# Patient Record
Sex: Male | Born: 1935 | Race: White | Hispanic: No | Smoking: Former smoker
Health system: Southern US, Community
[De-identification: ages and names within clinical notes are randomized; demographics above are authoritative.]

## PROBLEM LIST (undated history)

## (undated) DIAGNOSIS — G459 Transient cerebral ischemic attack, unspecified: Secondary | ICD-10-CM

## (undated) DIAGNOSIS — E119 Type 2 diabetes mellitus without complications: Secondary | ICD-10-CM

---

## 2019-08-01 ENCOUNTER — Other Ambulatory Visit: Payer: Self-pay

## 2019-08-01 ENCOUNTER — Inpatient Hospital Stay
Admission: EM | Admit: 2019-08-01 | Discharge: 2019-08-07 | DRG: 065 | Disposition: A | Discharge status: Skilled Nursing Facility | Payer: Medicare Other | Source: Skilled Nursing Facility | Attending: Family Medicine | Admitting: Family Medicine

## 2019-08-01 ENCOUNTER — Emergency Department: Payer: Medicare Other

## 2019-08-01 DIAGNOSIS — W19XXXA Unspecified fall, initial encounter: Secondary | ICD-10-CM | POA: Diagnosis present

## 2019-08-01 DIAGNOSIS — R29704 NIHSS score 4: Secondary | ICD-10-CM | POA: Diagnosis not present

## 2019-08-01 DIAGNOSIS — I69398 Other sequelae of cerebral infarction: Secondary | ICD-10-CM

## 2019-08-01 DIAGNOSIS — Z9181 History of falling: Secondary | ICD-10-CM

## 2019-08-01 DIAGNOSIS — G20 Parkinson's disease: Secondary | ICD-10-CM | POA: Diagnosis present

## 2019-08-01 DIAGNOSIS — R29701 NIHSS score 1: Secondary | ICD-10-CM | POA: Diagnosis present

## 2019-08-01 DIAGNOSIS — Z87891 Personal history of nicotine dependence: Secondary | ICD-10-CM

## 2019-08-01 DIAGNOSIS — E119 Type 2 diabetes mellitus without complications: Secondary | ICD-10-CM

## 2019-08-01 DIAGNOSIS — Z8673 Personal history of transient ischemic attack (TIA), and cerebral infarction without residual deficits: Secondary | ICD-10-CM

## 2019-08-01 DIAGNOSIS — Z7984 Long term (current) use of oral hypoglycemic drugs: Secondary | ICD-10-CM

## 2019-08-01 DIAGNOSIS — R2689 Other abnormalities of gait and mobility: Secondary | ICD-10-CM | POA: Diagnosis present

## 2019-08-01 DIAGNOSIS — I6340 Cerebral infarction due to embolism of unspecified cerebral artery: Principal | ICD-10-CM | POA: Diagnosis present

## 2019-08-01 DIAGNOSIS — R55 Syncope and collapse: Secondary | ICD-10-CM

## 2019-08-01 DIAGNOSIS — I10 Essential (primary) hypertension: Secondary | ICD-10-CM | POA: Diagnosis present

## 2019-08-01 DIAGNOSIS — E11649 Type 2 diabetes mellitus with hypoglycemia without coma: Secondary | ICD-10-CM | POA: Diagnosis present

## 2019-08-01 DIAGNOSIS — I2510 Atherosclerotic heart disease of native coronary artery without angina pectoris: Secondary | ICD-10-CM

## 2019-08-01 DIAGNOSIS — I639 Cerebral infarction, unspecified: Secondary | ICD-10-CM | POA: Diagnosis present

## 2019-08-01 DIAGNOSIS — R413 Other amnesia: Secondary | ICD-10-CM | POA: Diagnosis present

## 2019-08-01 DIAGNOSIS — I69319 Unspecified symptoms and signs involving cognitive functions following cerebral infarction: Secondary | ICD-10-CM

## 2019-08-01 DIAGNOSIS — I082 Rheumatic disorders of both aortic and tricuspid valves: Secondary | ICD-10-CM | POA: Diagnosis present

## 2019-08-01 DIAGNOSIS — Z20822 Contact with and (suspected) exposure to covid-19: Secondary | ICD-10-CM | POA: Diagnosis present

## 2019-08-01 DIAGNOSIS — N179 Acute kidney failure, unspecified: Secondary | ICD-10-CM | POA: Diagnosis present

## 2019-08-01 DIAGNOSIS — E785 Hyperlipidemia, unspecified: Secondary | ICD-10-CM | POA: Diagnosis present

## 2019-08-01 HISTORY — DX: Type 2 diabetes mellitus without complications: E11.9

## 2019-08-01 HISTORY — DX: Transient cerebral ischemic attack, unspecified: G45.9

## 2019-08-01 LAB — GLUCOSE, CAPILLARY: Glucose-Capillary: 90 mg/dL (ref 70–99)

## 2019-08-01 LAB — CBC WITH DIFFERENTIAL/PLATELET
Abs Immature Granulocytes: 0.05 10*3/uL (ref 0.00–0.07)
Basophils Absolute: 0 10*3/uL (ref 0.0–0.1)
Basophils Relative: 0 %
Eosinophils Absolute: 0 10*3/uL (ref 0.0–0.5)
Eosinophils Relative: 0 %
HCT: 51.7 % (ref 39.0–52.0)
Hemoglobin: 16.4 g/dL (ref 13.0–17.0)
Immature Granulocytes: 1 %
Lymphocytes Relative: 8 %
Lymphs Abs: 0.7 10*3/uL (ref 0.7–4.0)
MCH: 29.9 pg (ref 26.0–34.0)
MCHC: 31.7 g/dL (ref 30.0–36.0)
MCV: 94.3 fL (ref 80.0–100.0)
Monocytes Absolute: 0.2 10*3/uL (ref 0.1–1.0)
Monocytes Relative: 3 %
Neutro Abs: 7.4 10*3/uL (ref 1.7–7.7)
Neutrophils Relative %: 88 %
Platelets: 76 10*3/uL — ABNORMAL LOW (ref 150–400)
RBC: 5.48 MIL/uL (ref 4.22–5.81)
RDW: 13.7 % (ref 11.5–15.5)
WBC: 8.4 10*3/uL (ref 4.0–10.5)
nRBC: 0 % (ref 0.0–0.2)

## 2019-08-01 LAB — TROPONIN I (HIGH SENSITIVITY): Troponin I (High Sensitivity): 19 ng/L — ABNORMAL HIGH (ref <18)

## 2019-08-01 LAB — COMPREHENSIVE METABOLIC PANEL
ALT: 17 U/L (ref 0–44)
AST: 35 U/L (ref 15–41)
Albumin: 4.5 g/dL (ref 3.5–5.0)
Alkaline Phosphatase: 57 U/L (ref 38–126)
Anion gap: 13 (ref 5–15)
BUN: 30 mg/dL — ABNORMAL HIGH (ref 8–23)
CO2: 25 mmol/L (ref 22–32)
Calcium: 10.1 mg/dL (ref 8.9–10.3)
Chloride: 99 mmol/L (ref 98–111)
Creatinine, Ser: 1.82 mg/dL — ABNORMAL HIGH (ref 0.61–1.24)
GFR calc Af Amer: 38 mL/min — ABNORMAL LOW (ref >60)
GFR calc non Af Amer: 33 mL/min — ABNORMAL LOW (ref >60)
Glucose, Bld: 140 mg/dL — ABNORMAL HIGH (ref 70–99)
Potassium: 4.9 mmol/L (ref 3.5–5.1)
Sodium: 137 mmol/L (ref 135–145)
Total Bilirubin: 1.4 mg/dL — ABNORMAL HIGH (ref 0.3–1.2)
Total Protein: 8.3 g/dL — ABNORMAL HIGH (ref 6.5–8.1)

## 2019-08-01 MED ORDER — SODIUM CHLORIDE 0.9% FLUSH
3.0000 mL | Freq: Two times a day (BID) | INTRAVENOUS | Status: DC
  Administered 2019-08-02 – 2019-08-07 (×10): 3 mL via INTRAVENOUS

## 2019-08-01 MED ORDER — ENOXAPARIN SODIUM 30 MG/0.3ML ~~LOC~~ SOLN
30.0000 mg | SUBCUTANEOUS | Status: DC
  Administered 2019-08-02: 01:00:00 30 mg via SUBCUTANEOUS
  Filled 2019-08-01: qty 0.3

## 2019-08-01 NOTE — ED Notes (Signed)
Pt given phone to speak with daughter °

## 2019-08-01 NOTE — ED Notes (Signed)
Attempted for 20g at R medial ac. Was able to draw blood but couldn't thread catheter. Vein blew. Blood sent. Will attempt again once pt back from CT.

## 2019-08-01 NOTE — ED Triage Notes (Signed)
Pt arrives via EMS from [facility] due to a reported syncopal episode and fall. Pt was last seen at 1PM today and was found prior to arrival to ED. PT does not remember events of what happened, was found on balcony of room. Reported that pt did not report to dinner at 5PM this afternoon.   Pt arrives to ED with no complaints, bruise noted on back of head, and pt is alert and oriented to person and place. EMS states pt has been shuffling when he ambulated with them which is not normal.

## 2019-08-01 NOTE — H&P (Addendum)
History and Physical    Aaron Robles ALP:[id number] DOB: 1931/11/21 DOA: 08/01/2019  PCP: System, Pcp Not In   Patient coming from: ALF I have personally briefly reviewed patient's old medical records in [place]  Chief Complaint altered mental status HPI: Aaron Robles is a 86 y.o. 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  Last known normal was at 1 PM.  Around 5 PM which was dinnertime patient did not come to the dining hall and he was found lying on his balcony.  He was awake when he was found but was somewhat confused and has no recollection of the events.  Was noted to be shuffling when he walked which they said was not his normal.  Patient denies pain.  Denies lightheadedness, chest pain, shortness of breath.  Was previously in his usual state of health  ED Course: Vitals in the emergency room were within normal limits.  Blood work significant for creatinine of 1.82.  Troponin was normal at 19.  Showed no acute ST-T wave changes.  Chest x-ray no acute disease.  Head and C-spine CT unremarkable.  Hospitalist consulted for admission for syncopal work-up.  Review of Systems: As per HPI otherwise 10 point review of systems negative.    Past Medical History:  Diagnosis Date   Diabetes mellitus without complication ([place])    TIA (transient ischemic attack)     History reviewed. No pertinent surgical history.   reports that he quit smoking about 10 years ago. He has never used smokeless tobacco. He reports that he does not drink alcohol or use drugs.  No Known Allergies  History reviewed. No pertinent family history.   Prior to Admission medications   Not on File    Physical Exam: Vitals:   08/01/19 2200 08/01/19 2215 08/01/19 2245 08/01/19 2330  BP: 135/83   133/89  Pulse: 74 76 82 74  Resp: (!) 24 19 14 20   Temp:      TempSrc:      SpO2: 100% 99% 98% 97%  Weight:      Height:          Vitals:   08/01/19 2200 08/01/19 2215 08/01/19 2245 08/01/19 2330  BP: 135/83   133/89  Pulse: 74 76 82 74  Resp: (!) 24 19 14 20   Temp:      TempSrc:      SpO2: 100% 99% 98% 97%  Weight:      Height:        Constitutional: Alert and awake, oriented x2, not in any acute distress. Eyes: PERLA, EOMI, irises appear normal, anicteric sclera,  ENMT: external ears and nose appear normal, normal hearing             Lips appears normal, oropharynx mucosa, tongue, posterior pharynx appear normal  Neck: neck appears normal, no masses, normal ROM, no thyromegaly, no JVD  CVS: S1-S2 clear, no murmur rubs or gallops,  , no carotid bruits, pedal pulses palpable, No LE edema Respiratory:  clear to auscultation bilaterally, no wheezing, rales or rhonchi. Respiratory effort normal. No accessory muscle use.  Abdomen: soft nontender, nondistended, normal bowel sounds, no hepatosplenomegaly, no hernias Musculoskeletal: : no cyanosis, clubbing , no contractures or atrophy Neuro: Cranial nerves II-XII intact, sensation, reflexes normal, strength Psych: judgement and insight appear normal, stable mood and affect,  Skin: no rashes or lesions or ulcers, no induration or nodules   Labs on Admission:  I have personally reviewed following labs and imaging studies  CBC: Recent Labs  Lab 08/01/19 2134  WBC 8.4  NEUTROABS 7.4  HGB 16.4  HCT 51.7  MCV 94.3  PLT 76*   Basic Metabolic Panel: Recent Labs  Lab 08/01/19 2134  NA 137  K 4.9  CL 99  CO2 25  GLUCOSE 140*  BUN 30*  CREATININE 1.82*  CALCIUM 10.1   GFR: Estimated Creatinine Clearance: 30.7 mL/min (A) (by C-G formula based on SCr of 1.82 mg/dL (H)). Liver Function Tests: Recent Labs  Lab 08/01/19 2134  AST 35  ALT 17  ALKPHOS 57  BILITOT 1.4*  PROT 8.3*  ALBUMIN 4.5   No results for input(s): LIPASE, AMYLASE in the last 168 hours. No results for input(s): AMMONIA in the last 168 hours. Coagulation Profile: No results  for input(s): INR, PROTIME in the last 168 hours. Cardiac Enzymes: No results for input(s): CKTOTAL, CKMB, CKMBINDEX, TROPONINI in the last 168 hours. BNP (last 3 results) No results for input(s): PROBNP in the last 8760 hours. HbA1C: No results for input(s): HGBA1C in the last 72 hours. CBG: Recent Labs  Lab 08/01/19 2224  GLUCAP 90   Lipid Profile: No results for input(s): CHOL, HDL, LDLCALC, TRIG, CHOLHDL, LDLDIRECT in the last 72 hours. Thyroid Function Tests: No results for input(s): TSH, T4TOTAL, FREET4, T3FREE, THYROIDAB in the last 72 hours. Anemia Panel: No results for input(s): VITAMINB12, FOLATE, FERRITIN, TIBC, IRON, RETICCTPCT in the last 72 hours. Urine analysis: No results found for: COLORURINE, APPEARANCEUR, LABSPEC, PHURINE, GLUCOSEU, HGBUR, BILIRUBINUR, KETONESUR, PROTEINUR, UROBILINOGEN, NITRITE, LEUKOCYTESUR  Radiological Exams on Admission: CT HEAD WO CONTRAST  Result Date: 08/01/2019 CLINICAL DATA:  86 year old male status post syncope and fall. EXAM: CT HEAD WITHOUT CONTRAST TECHNIQUE: Contiguous axial images were obtained from the base of the skull through the vertex without intravenous contrast. COMPARISON:  None. FINDINGS: Brain: Ex vacuo appearing ventricular enlargement. No midline shift, ventriculomegaly, mass effect, evidence of mass lesion, intracranial hemorrhage or evidence of cortically based acute infarction. Patchy and confluent bilateral cerebral white matter hypodensity. Deep gray nuclei appear spared. No cortical encephalomalacia identified. Occasional mild dural calcifications. Vascular: Extensive Calcified atherosclerosis at the skull base. No suspicious intracranial vascular hyperdensity. Skull: No fracture identified. Sinuses/Orbits: Visualized paranasal sinuses and mastoids are clear. Other: No orbit or scalp soft tissue injury identified. IMPRESSION: 1. No acute traumatic injury identified. 2. Confluent cerebral white matter hypodensity, most  commonly due to chronic small vessel disease. Electronically Signed   By: Odessa Fleming M.D.   On: 08/01/2019 22:02   CT CERVICAL SPINE WO CONTRAST  Result Date: 08/01/2019 CLINICAL DATA:  86 year old male status post syncope and fall. EXAM: CT CERVICAL SPINE WITHOUT CONTRAST TECHNIQUE: Multidetector CT imaging of the cervical spine was performed without intravenous contrast. Multiplanar CT image reconstructions were also generated. COMPARISON:  Head CT today reported separately. FINDINGS: Alignment: Straightening of cervical lordosis. Cervicothoracic junction alignment is within normal limits. Bilateral posterior element alignment is within normal limits. Skull base and vertebrae: Visualized skull base is intact. No atlanto-occipital dissociation. No acute osseous abnormality identified. Soft tissues and spinal canal: No prevertebral fluid or swelling. No visible canal hematoma. Mild retained secretions in the nasopharynx. Calcified carotid atherosclerosis is bulky on the left. Disc levels: Degenerative subchondral cysts scattered in the cervical endplates and also in the odontoid process (sagittal image 22). Many of the endplate geodes are vacuum containing. Advanced disc and endplate degeneration at C5-C6 where mild spinal stenosis is suspected.  Upper chest: Visible upper thoracic levels appear intact. Negative lung apices. IMPRESSION: 1.  No acute traumatic injury identified in the cervical spine. 2. Widespread cervical spine degeneration with up to mild spinal stenosis. 3. Bulky calcified atherosclerosis of the left carotid. Electronically Signed   By: Odessa Fleming M.D.   On: 08/01/2019 22:06   DG Chest Portable 1 View  Result Date: 08/01/2019 CLINICAL DATA:  Syncopal episode and fall. EXAM: PORTABLE CHEST 1 VIEW COMPARISON:  None. FINDINGS: Mild atelectasis is seen within the right lung base. There is no evidence of a pleural effusion or pneumothorax. The heart size and mediastinal contours are within normal  limits. There is mild calcification of the aortic arch. Degenerative changes seen throughout the bilateral shoulders and thoracic spine. IMPRESSION: No active disease. Electronically Signed   By: Aram Candela M.D.   On: 08/01/2019 22:28    EKG: Independently reviewed.   Assessment/Plan Principal Problem:   Syncope and collapse -Uncertain etiology given negative work-up in the emergency room but given history of stroke warrants CVA work-up the patient has no focal neurologic deficit --(Addendum: History from daughter, who is a physician :patient who follows at the [place], has a history of shuffling gait and is currently being evaluated for parkinsonism and also has a history of frequent falls, and memory loss following CVA in 2019) .  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

## 2019-08-01 NOTE — ED Provider Notes (Signed)
[facility] Emergency Department Provider Note    None    (approximate)  I have reviewed the triage vital signs and the nursing notes.   HISTORY  Chief Complaint Altered Mental Status and Loss of Consciousness    HPI Aaron Robles is a 86 y.o. male   presents from [facility] with a unwitnessed syncopal event that occurred at some point this afternoon.  Last seen normal was around 1.  He did not report tenderness when staff found him on the ground at his balcony.  Was unable to walk.  Does have a history of CVA with left-sided weakness that he recovered from.  Patient unable to provide any additional history.  He denies any pain.  No numbness or tingling.  No chest pain or shortness of breath.  No nausea or vomiting.   Past Medical History:  Diagnosis Date  . Diabetes mellitus without complication (HCC)   . TIA (transient ischemic attack)    History reviewed. No pertinent family history.  There are no problems to display for this patient.     Prior to Admission medications   Not on File    Allergies Patient has no known allergies.    Social History Social History   Tobacco Use  . Smoking status: Former Smoker    Quit date: 07/31/2009    Years since quitting: 10.0  . Smokeless tobacco: Never Used  Substance Use Topics  . Alcohol use: Never  . Drug use: Never    Review of Systems Patient denies headaches, rhinorrhea, blurry vision, numbness, shortness of breath, chest pain, edema, cough, abdominal pain, nausea, vomiting, diarrhea, dysuria, fevers, rashes or hallucinations unless otherwise stated above in HPI. ____________________________________________   PHYSICAL EXAM:  VITAL SIGNS: Vitals:   08/01/19 2215 08/01/19 2245  BP:    Pulse: 76 82  Resp: 19 14  Temp:    SpO2: 99% 98%    Constitutional: Alert and oriented.  Eyes: Conjunctivae are normal.  Head: Atraumatic. Nose: No congestion/rhinnorhea. Mouth/Throat: Mucous  membranes are moist.   Neck: No stridor. Painless ROM.  Cardiovascular: Normal rate, regular rhythm. Grossly normal heart sounds.  Good peripheral circulation. Respiratory: Normal respiratory effort.  No retractions. Lungs CTAB. Gastrointestinal: Soft and nontender. No distention. No abdominal bruits. No CVA tenderness. Genitourinary:  Musculoskeletal: No lower extremity tenderness nor edema.  No joint effusions. Neurologic:CN- intact.  No facial droop, Normal FNF.  Normal heel to shin.  Sensation intact bilaterally. Normal speech and language. No gross focal neurologic deficits are appreciated. No gait instability. Skin:  Skin is warm, dry and intact. No rash noted. Psychiatric: Mood and affect are normal. Speech and behavior are normal.  ____________________________________________   LABS (all labs ordered are listed, but only abnormal results are displayed)  Results for orders placed or performed during the hospital encounter of 08/01/19 (from the past 24 hour(s))  CBC with Differential/Platelet     Status: Abnormal   Collection Time: 08/01/19  9:34 PM  Result Value Ref Range   WBC 8.4 4.0 - 10.5 K/uL   RBC 5.48 4.22 - 5.81 MIL/uL   Hemoglobin 16.4 13.0 - 17.0 g/dL   HCT 17.7 93.9 - 03.0 %   MCV 94.3 80.0 - 100.0 fL   MCH 29.9 26.0 - 34.0 pg   MCHC 31.7 30.0 - 36.0 g/dL   RDW 09.2 33.0 - 07.6 %   Platelets 76 (L) 150 - 400 K/uL   nRBC 0.0 0.0 - 0.2 %  Neutrophils Relative % 88 %   Neutro Abs 7.4 1.7 - 7.7 K/uL   Lymphocytes Relative 8 %   Lymphs Abs 0.7 0.7 - 4.0 K/uL   Monocytes Relative 3 %   Monocytes Absolute 0.2 0.1 - 1.0 K/uL   Eosinophils Relative 0 %   Eosinophils Absolute 0.0 0.0 - 0.5 K/uL   Basophils Relative 0 %   Basophils Absolute 0.0 0.0 - 0.1 K/uL   Immature Granulocytes 1 %   Abs Immature Granulocytes 0.05 0.00 - 0.07 K/uL  Comprehensive metabolic panel     Status: Abnormal   Collection Time: 08/01/19  9:34 PM  Result Value Ref Range   Sodium 137 135  - 145 mmol/L   Potassium 4.9 3.5 - 5.1 mmol/L   Chloride 99 98 - 111 mmol/L   CO2 25 22 - 32 mmol/L   Glucose, Bld 140 (H) 70 - 99 mg/dL   BUN 30 (H) 8 - 23 mg/dL   Creatinine, Ser 1.82 (H) 0.61 - 1.24 mg/dL   Calcium 10.1 8.9 - 10.3 mg/dL   Total Protein 8.3 (H) 6.5 - 8.1 g/dL   Albumin 4.5 3.5 - 5.0 g/dL   AST 35 15 - 41 U/L   ALT 17 0 - 44 U/L   Alkaline Phosphatase 57 38 - 126 U/L   Total Bilirubin 1.4 (H) 0.3 - 1.2 mg/dL   GFR calc non Af Amer 33 (L) >60 mL/min   GFR calc Af Amer 38 (L) >60 mL/min   Anion gap 13 5 - 15  Troponin I (High Sensitivity)     Status: Abnormal   Collection Time: 08/01/19  9:34 PM  Result Value Ref Range   Troponin I (High Sensitivity) 19 (H) <18 ng/L  Glucose, capillary     Status: None   Collection Time: 08/01/19 10:24 PM  Result Value Ref Range   Glucose-Capillary 90 70 - 99 mg/dL   ____________________________________________  EKG My review and personal interpretation at Time: 21:17   Indication: syncope  Rate: 80  Rhythm: sinus Axis: normal Other: normal intervals, no stemi,  ____________________________________________  RADIOLOGY  I personally reviewed all radiographic images ordered to evaluate for the above acute complaints and reviewed radiology reports and findings.  These findings were personally discussed with the patient.  Please see medical record for radiology report.  ____________________________________________   PROCEDURES  Procedure(s) performed:  Procedures    Critical Care performed: no ____________________________________________   INITIAL IMPRESSION / ASSESSMENT AND PLAN / ED COURSE  Pertinent labs & imaging results that were available during my care of the patient were reviewed by me and considered in my medical decision making (see chart for details).   DDX: Dysrhythmia, dehydration, anemia, electrolyte abnormality, CVA, TIA, SDH, SAH  Aaron Robles is a 86 y.o. who presents to the ED with symptoms as  described above.  Patient currently asymptomatic nontoxic-appearing.  Does have a history of CVA.  Possible TIA with patient loss consciousness.  Have a lower suspicion for seizure.  EKG shows no evidence of acute ischemia patient denies any chest pain at this time but certainly concerning for cardiac etiology.  His abdominal exam is soft and benign.  CT imaging is fortunately reassuring.  Do feel patient will require observation the hospital for syncope work-up.     The patient was evaluated in Emergency Department today for the symptoms described in the history of present illness. He/she was evaluated in the context of the global COVID-19 pandemic, which necessitated consideration that  the patient might be at risk for infection with the SARS-CoV-2 virus that causes COVID-19. Institutional protocols and algorithms that pertain to the evaluation of patients at risk for COVID-19 are in a state of rapid change based on information released by regulatory bodies including the CDC and federal and state organizations. These policies and algorithms were followed during the patient's care in the ED.  As part of my medical decision making, I reviewed the following data within the electronic MEDICAL RECORD NUMBER Nursing notes reviewed and incorporated, Labs reviewed, notes from prior ED visits and [place] Controlled Substance Database   ____________________________________________   FINAL CLINICAL IMPRESSION(S) / ED DIAGNOSES  Final diagnoses:  Syncope and collapse      NEW MEDICATIONS STARTED DURING THIS VISIT:  New Prescriptions   No medications on file     Note:  This document was prepared using Dragon voice recognition software and may include unintentional dictation errors.    Willy Eddy, MD 08/01/19 2325

## 2019-08-01 NOTE — ED Notes (Signed)
Pt given urinal and prompted to use it as soon as he is able to give a urine sample. Pt agrees to notify this RN using call bell once sample ready. Pt declined second blanket. Denies any other needs currently. Bed locked low. Rails up. Call bell within reach.

## 2019-08-02 ENCOUNTER — Observation Stay: Payer: Medicare Other

## 2019-08-02 ENCOUNTER — Observation Stay (HOSPITAL_COMMUNITY)
Admit: 2019-08-02 | Discharge: 2019-08-02 | Disposition: A | Discharge status: Home / Self Care | Payer: Medicare Other | Attending: Internal Medicine | Admitting: Internal Medicine

## 2019-08-02 DIAGNOSIS — I082 Rheumatic disorders of both aortic and tricuspid valves: Secondary | ICD-10-CM | POA: Diagnosis present

## 2019-08-02 DIAGNOSIS — I10 Essential (primary) hypertension: Secondary | ICD-10-CM | POA: Diagnosis present

## 2019-08-02 DIAGNOSIS — E119 Type 2 diabetes mellitus without complications: Secondary | ICD-10-CM | POA: Diagnosis not present

## 2019-08-02 DIAGNOSIS — I2583 Coronary atherosclerosis due to lipid rich plaque: Secondary | ICD-10-CM | POA: Diagnosis not present

## 2019-08-02 DIAGNOSIS — Z7984 Long term (current) use of oral hypoglycemic drugs: Secondary | ICD-10-CM | POA: Diagnosis not present

## 2019-08-02 DIAGNOSIS — R413 Other amnesia: Secondary | ICD-10-CM | POA: Diagnosis present

## 2019-08-02 DIAGNOSIS — I69398 Other sequelae of cerebral infarction: Secondary | ICD-10-CM | POA: Diagnosis not present

## 2019-08-02 DIAGNOSIS — Z87891 Personal history of nicotine dependence: Secondary | ICD-10-CM | POA: Diagnosis not present

## 2019-08-02 DIAGNOSIS — R29701 NIHSS score 1: Secondary | ICD-10-CM | POA: Diagnosis present

## 2019-08-02 DIAGNOSIS — R55 Syncope and collapse: Secondary | ICD-10-CM

## 2019-08-02 DIAGNOSIS — R2689 Other abnormalities of gait and mobility: Secondary | ICD-10-CM | POA: Diagnosis present

## 2019-08-02 DIAGNOSIS — I361 Nonrheumatic tricuspid (valve) insufficiency: Secondary | ICD-10-CM

## 2019-08-02 DIAGNOSIS — Z9181 History of falling: Secondary | ICD-10-CM | POA: Diagnosis not present

## 2019-08-02 DIAGNOSIS — G20 Parkinson's disease: Secondary | ICD-10-CM | POA: Diagnosis present

## 2019-08-02 DIAGNOSIS — I6340 Cerebral infarction due to embolism of unspecified cerebral artery: Secondary | ICD-10-CM | POA: Diagnosis present

## 2019-08-02 DIAGNOSIS — Z20822 Contact with and (suspected) exposure to covid-19: Secondary | ICD-10-CM | POA: Diagnosis present

## 2019-08-02 DIAGNOSIS — N179 Acute kidney failure, unspecified: Secondary | ICD-10-CM | POA: Diagnosis present

## 2019-08-02 DIAGNOSIS — I639 Cerebral infarction, unspecified: Secondary | ICD-10-CM | POA: Diagnosis present

## 2019-08-02 DIAGNOSIS — I69319 Unspecified symptoms and signs involving cognitive functions following cerebral infarction: Secondary | ICD-10-CM | POA: Diagnosis not present

## 2019-08-02 DIAGNOSIS — E11649 Type 2 diabetes mellitus with hypoglycemia without coma: Secondary | ICD-10-CM | POA: Diagnosis present

## 2019-08-02 DIAGNOSIS — R29704 NIHSS score 4: Secondary | ICD-10-CM | POA: Diagnosis not present

## 2019-08-02 DIAGNOSIS — W19XXXA Unspecified fall, initial encounter: Secondary | ICD-10-CM | POA: Diagnosis present

## 2019-08-02 DIAGNOSIS — E785 Hyperlipidemia, unspecified: Secondary | ICD-10-CM | POA: Diagnosis present

## 2019-08-02 DIAGNOSIS — I2510 Atherosclerotic heart disease of native coronary artery without angina pectoris: Secondary | ICD-10-CM | POA: Diagnosis present

## 2019-08-02 LAB — URINALYSIS, COMPLETE (UACMP) WITH MICROSCOPIC
Bacteria, UA: NONE SEEN
Bilirubin Urine: NEGATIVE
Glucose, UA: NEGATIVE mg/dL
Hgb urine dipstick: NEGATIVE
Ketones, ur: NEGATIVE mg/dL
Leukocytes,Ua: NEGATIVE
Nitrite: NEGATIVE
Protein, ur: NEGATIVE mg/dL
Specific Gravity, Urine: 1.019 (ref 1.005–1.030)
pH: 5 (ref 5.0–8.0)

## 2019-08-02 LAB — LIPID PANEL
Cholesterol: 207 mg/dL — ABNORMAL HIGH (ref 0–200)
HDL: 40 mg/dL — ABNORMAL LOW (ref >40)
LDL Cholesterol: 132 mg/dL — ABNORMAL HIGH (ref 0–99)
Total CHOL/HDL Ratio: 5.2 RATIO
Triglycerides: 174 mg/dL — ABNORMAL HIGH (ref <150)
VLDL: 35 mg/dL (ref 0–40)

## 2019-08-02 LAB — SARS CORONAVIRUS 2 (TAT 6-24 HRS): SARS Coronavirus 2: NEGATIVE

## 2019-08-02 LAB — ECHOCARDIOGRAM COMPLETE
Height: 67.5 in
Weight: 3015.89 oz

## 2019-08-02 LAB — TSH: TSH: 0.848 u[IU]/mL (ref 0.350–4.500)

## 2019-08-02 LAB — GLUCOSE, CAPILLARY
Glucose-Capillary: 110 mg/dL — ABNORMAL HIGH (ref 70–99)
Glucose-Capillary: 35 mg/dL — CL (ref 70–99)
Glucose-Capillary: 112 mg/dL — ABNORMAL HIGH (ref 70–99)
Glucose-Capillary: 81 mg/dL (ref 70–99)
Glucose-Capillary: 141 mg/dL — ABNORMAL HIGH (ref 70–99)

## 2019-08-02 LAB — TROPONIN I (HIGH SENSITIVITY): Troponin I (High Sensitivity): 18 ng/L — ABNORMAL HIGH (ref <18)

## 2019-08-02 MED ORDER — PERFLUTREN LIPID MICROSPHERE
1.0000 mL | INTRAVENOUS | Status: AC | PRN
  Administered 2019-08-02: 4 mL via INTRAVENOUS
  Filled 2019-08-02: qty 10

## 2019-08-02 MED ORDER — INSULIN ASPART 100 UNIT/ML ~~LOC~~ SOLN: 0.0000 [IU] | Freq: Every day | SUBCUTANEOUS | Status: DC

## 2019-08-02 MED ORDER — SODIUM CHLORIDE 0.9 % IV SOLN: INTRAVENOUS | Status: DC

## 2019-08-02 MED ORDER — SODIUM CHLORIDE 0.9 % IV SOLN: INTRAVENOUS | Status: AC

## 2019-08-02 MED ORDER — DEXTROSE 50 % IV SOLN
1.0000 | Freq: Once | INTRAVENOUS | Status: AC
  Administered 2019-08-02: 50 mL via INTRAVENOUS

## 2019-08-02 MED ORDER — INSULIN ASPART 100 UNIT/ML ~~LOC~~ SOLN: 0.0000 [IU] | Freq: Three times a day (TID) | SUBCUTANEOUS | Status: DC

## 2019-08-02 MED ORDER — ENOXAPARIN SODIUM 40 MG/0.4ML ~~LOC~~ SOLN
40.0000 mg | SUBCUTANEOUS | Status: DC
  Administered 2019-08-03 – 2019-08-06 (×5): 40 mg via SUBCUTANEOUS
  Filled 2019-08-02 (×5): qty 0.4

## 2019-08-02 NOTE — ED Notes (Signed)
CBG 35  1 amp d50 given per NP

## 2019-08-02 NOTE — Evaluation (Signed)
Clinical/Bedside Swallow Evaluation Patient Details  Name: Aaron Robles MRN: [id number] Date of Birth: [birth year]  Today's Date: 08/02/2019 Time: SLP Start Time (ACUTE ONLY): 1335 SLP Stop Time (ACUTE ONLY): 1415 SLP Time Calculation (min) (ACUTE ONLY): 40 min  Past Medical History:  Past Medical History:  Diagnosis Date  . Diabetes mellitus without complication (HCC)   . TIA (transient ischemic attack)    Past Surgical History: History reviewed. No pertinent surgical history. HPI:  Pt is 86 y/o M wtih PMH: TIA w/o residual deficits, CAD, AKI, DM. Pt has been living at his ALF for 3 years, per his report. Pt was found down on his balcony at [facility] after unwitnessed fall. MRI indicates small acute infarct of L basal ganglia. Unsure of pt's baseline Cognitive-linguistic communication skills.    Assessment / Plan / Recommendation Clinical Impression  Pt appears to present w/ adequate oropharyngeal phase swallowing function w/ no neuromuscular swallowing deficits or oropharyngeal phase dysphagia noted during exam. Pt is at reduced risk for aspiration following general aspiration precautions. Pt required positioning support and cues to fully sit upright in bed for eating/drinking. Given setup, he then consumed trials of thin liquids, purees, and solids w/ no overt clinical s/s of aspiration noted; clear vocal quality b/t trials and no decline in respiratory status noted. Oral phase appeared [facility] for bolus management, mastication, and timely A-P transfer of all consistencies. OM exam was [facility] for lingual/labial movements, strength. No unilateral weakness. Speech was Clear. Recommend continue Regluar diet w/ thin liquids; general aspiration precautions. Pills w/ water but applesauce/puree if needed per NSG assessment. Support w/ tray setup and positioning at meals. No further skilled ST services indicated for swallowing currently. W/ regard to pt's Cognitive-linguistic communication skills, suspect  degree of decline, but unsure of pt's Baseline Cognitive status at this time. Pt resides at an ALF and family is not present. Pt was awake/alert, verbally conversive w/ SLP though reduced voluntary verbal output. Pt answered basic questions re: self adequately though reduced Cognitive linguistic skills for Orientation, awareness of environment, and follow through w/ tasks noted; he responded well to verbal/tactile cues. Recommend f/u w/ Neurology Outpatient for formal assessment of Cognitive status to address any new needs.  SLP Visit Diagnosis: Dysphagia, unspecified (R13.10)    Aspiration Risk  (reduced following general precautions)    Diet Recommendation  Regular diet w/ Thin liquids; general aspiration precautions. Tray setup and support positioning upright for all meals.  Medication Administration: Whole meds with liquid(but use of puree if needed for safer swallowing)    Other  Recommendations Recommended Consults: (Neurology f/u for formal assessment of Cognitive baseline) Oral Care Recommendations: Oral care BID;Patient independent with oral care;Staff/trained caregiver to provide oral care(support) Other Recommendations: (n/a)   Follow up Recommendations None      Frequency and Duration (n/a)  (n/a)       Prognosis Prognosis for Safe Diet Advancement: Good Barriers to Reach Goals: (n/a)      Swallow Study   General Date of Onset: 08/01/19 HPI: Pt is 86 y/o M wtih PMH: TIA w/o residual deficits, CAD, AKI, DM. Pt has been living at his ALF for 3 years, per his report. Pt was found down on his balcony at [facility]F after unwitnessed fall. MRI indicates small acute infarct of L basal ganglia. Unsure of pt's baseline Cognitive-linguistic communication skills.  Type of Study: Bedside Swallow Evaluation Previous Swallow Assessment: none Diet Prior to this Study: Regular;Thin liquids Temperature Spikes Noted: No(wbc 8.4)  Respiratory Status: Room air History of Recent  Intubation: No Behavior/Cognition: Alert;Cooperative;Pleasant mood;Distractible;Requires cueing(needed cues) Oral Cavity Assessment: Within Functional Limits Oral Care Completed by SLP: Yes Oral Cavity - Dentition: Adequate natural dentition Vision: Functional for self-feeding Self-Feeding Abilities: Able to feed self;Needs assist;Needs set up Patient Positioning: Upright in bed(needed cues/assist in positioning fully upright) Baseline Vocal Quality: Normal Volitional Cough: Strong Volitional Swallow: Able to elicit    Oral/Motor/Sensory Function Overall Oral Motor/Sensory Function: Within functional limits   Ice Chips Ice chips: Within functional limits Presentation: Spoon(fed; 2 trials)   Thin Liquid Thin Liquid: Within functional limits Presentation: Cup;Self Fed(~4+ ozs total)    Nectar Thick Nectar Thick Liquid: Not tested   Honey Thick Honey Thick Liquid: Not tested   Puree Puree: Within functional limits Presentation: Spoon;Self Fed(3-4 ozs)   Solid     Solid: Within functional limits Presentation: Self Fed;Spoon(crackers and ice cream: 10+)       Orinda Kenner, MS, CCC-SLP Aamilah Augenstein 08/02/2019,4:14 PM

## 2019-08-02 NOTE — Evaluation (Signed)
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 Pt was found down on his balcony at [facility] after unwitnessed fall. MRI indicates small acute infarct of L basal ganglia.   Clinical Impression   Pt was seen for OT evaluation this date. Prior to hospital admission, pt reports being Indep with ADLs and ADL mobility with no AD. Pt lives alone in apartment in ALF with elevator to his floor and level entry. Pt's medications and meals are managed at facility. Currently pt demonstrates impairments as described below (See OT problem list) which functionally limit his ability to perform ADL/self-care tasks. Pt currently requires MIN A with ADL transfers with HHA and MIN/MOD A with LB ADLs d/t decreased dynamic sitting balance.  Pt would benefit from skilled OT to address noted impairments and functional limitations (see below for any additional details) in order to maximize safety and independence while minimizing falls risk and caregiver burden. Upon hospital discharge, recommend HHOT to maximize pt safety and return to functional independence during meaningful occupations of daily life.      Follow Up Recommendations  Home health OT    Equipment Recommendations  3 in 1 bedside commode;Tub/shower seat    Recommendations for Other Services       Precautions / Restrictions Precautions Precautions: Fall Restrictions Weight Bearing Restrictions: No      Mobility Bed Mobility Overal bed mobility: Modified Independent             General bed mobility comments: HOB elevated  Transfers Overall transfer level: Needs assistance Equipment used: 1 person hand held assist Transfers: Sit to/from Stand Sit to Stand: Min assist         General transfer comment: MIN verbal cues to sequence task and for  confidence as pt states x2 "I don't want to fall in the floor"    Balance Overall balance assessment: Needs assistance Sitting-balance support: Feet supported;Single extremity supported Sitting balance-Leahy Scale: Fair     Standing balance support: Single extremity supported;During functional activity Standing balance-Leahy Scale: Fair                             ADL either performed or assessed with clinical judgement   ADL Overall ADL's : Needs assistance/impaired Eating/Feeding: Independent;Sitting   Grooming: Wash/dry hands;Set up;Sitting           Upper Body Dressing : Minimal assistance;Sitting   Lower Body Dressing: Moderate assistance;Sit to/from stand Lower Body Dressing Details (indicate cue type and reason): MIN A for sit to stand, but MOD A to thread socks in sitting. Toilet Transfer: Minimal assistance   Toileting- Clothing Manipulation and Hygiene: Minimal assistance;Moderate assistance;Sit to/from stand       Functional mobility during ADLs: Minimal assistance(hand held assistance to take 4-5 steps left, right, left at bed side. Pt noted to shuffle.)       Vision Patient Visual Report: No change from baseline Additional Comments: pt reports no change in vision. Difficult to formally assess d/t some difficulty sequencing higher level commands.     Perception     Praxis      Pertinent Vitals/Pain Pain Assessment: No/denies pain     Hand Dominance     Extremity/Trunk Assessment Upper Extremity Assessment Upper Extremity Assessment: Overall WFL for tasks assessed(shld,  elbow, and grip bilaterally grossly 4/5)   Lower Extremity Assessment Lower Extremity Assessment: Defer to PT evaluation;Generalized weakness       Communication Communication Communication: No difficulties   Cognition Arousal/Alertness: Awake/alert Behavior During Therapy: WFL for tasks assessed/performed Overall Cognitive Status: No family/caregiver present to  determine baseline cognitive functioning                                 General Comments: Pt oriented to self and some aspects of location-states "ER somewhere", but unable to state city, hospital and is not oriented to temporal concepts including month/year. Pt does vaguely say that 2020 was the year with the virus.   General Comments       Exercises Other Exercises Other Exercises: OT facilitates education with pt re: role of OT in acute setting. Pt with moderate reception.   Shoulder Instructions      Home Living Family/patient expects to be discharged to:: Assisted living Living Arrangements: Alone Available Help at Discharge: Family;Available PRN/intermittently(has dtr in [place] and Son in [place] who check on him) Type of Home: Apartment Home Access: Level entry     Home Layout: One level     Bathroom Shower/Tub: Walk-in shower         Home Equipment: Environmental consultant - 4 wheels;Cane - single point   Additional Comments: Pt is poor historian, unsure efficacy of some of PLOF information. Pt states he was driving.      Prior Functioning/Environment          Comments: Pt reports he was bathing and dressing himself, states he has 4WW, but does not use. Facility provides meals and med mgt. Unsure efficacy of some of pt's PLOF information as pt not oriented to month/year.        OT Problem List: Decreased strength;Decreased activity tolerance;Impaired balance (sitting and/or standing);Decreased safety awareness;Decreased knowledge of use of DME or AE      OT Treatment/Interventions: Self-care/ADL training;Therapeutic exercise;Energy conservation;DME and/or AE instruction;Therapeutic activities;Patient/family education;Balance training    OT Goals(Current goals can be found in the care plan section) Acute Rehab OT Goals Patient Stated Goal: to just feel more myself OT Goal Formulation: With patient Time For Goal Achievement: 08/16/19 Potential to Achieve  Goals: Good  OT Frequency: Min 2X/week   Barriers to D/C:            Co-evaluation              AM-PAC OT "6 Clicks" Daily Activity     Outcome Measure Help from another person eating meals?: None Help from another person taking care of personal grooming?: A Little Help from another person toileting, which includes using toliet, bedpan, or urinal?: A Little Help from another person bathing (including washing, rinsing, drying)?: A Little Help from another person to put on and taking off regular upper body clothing?: None Help from another person to put on and taking off regular lower body clothing?: A Little 6 Click Score: 20   End of Session Equipment Utilized During Treatment: Gait belt;Rolling walker Nurse Communication: Mobility status  Activity Tolerance: Patient tolerated treatment well Patient left: in bed;with call bell/phone within reach  OT Visit Diagnosis: Unsteadiness on feet (R26.81);Muscle weakness (generalized) (M62.81)                Time: 5009-3818 OT Time Calculation (min): 23 min Charges:  OT General Charges $OT Visit: 1 Visit OT Evaluation $OT Eval Moderate  Complexity: 1 Mod OT Treatments $Self Care/Home Management : 8-22 mins  Gerrianne Scale, MS, OTR/L ascom [phone] 08/02/19, 3:24 PM

## 2019-08-02 NOTE — Progress Notes (Signed)
PHARMACIST - PHYSICIAN COMMUNICATION  CONCERNING:  Enoxaparin (Lovenox) for DVT Prophylaxis   RECOMMENDATION: Patient was prescribed enoxaprin 30mg  q24 hours for VTE prophylaxis.   Filed Weights   08/01/19 2123  Weight: 85.5 kg (188 lb 7.9 oz)    Body mass index is 29.09 kg/m.  Estimated Creatinine Clearance: 30.7 mL/min (A) (by C-G formula based on SCr of 1.82 mg/dL (H)).  Based on [facility] policy patient is candidate for enoxaparin 40mg  every  24 hours based on CrCl > 32ml/min or Weight  >45kg   DESCRIPTION: Pharmacy has adjusted enoxaparin dose per [facility] policy.  Patient is now receiving enoxaparin 40mg  every 24 hours.  31m, PharmD, BCPS Clinical Pharmacist 08/02/2019 9:12 AM

## 2019-08-02 NOTE — ED Notes (Signed)
Patient back from procedure. Patient having procedure at the bedside.

## 2019-08-02 NOTE — ED Notes (Signed)
Awaiting lovenox from pharmacy. 

## 2019-08-02 NOTE — Consult Note (Signed)
Reason for Consult: confusion and s/p fall  Requesting Physician: Dr. Para March   CC: confusion/ s/p fall HPI: Aaron Robles is an 86 y.o. male  with medical history significant for history of CVA without residual deficit, CAD who presents with an unwitnessed fall.  Last known normal was at 1 PM yesterday.  Around 5 PM which was dinnertime patient did not come to the dining hall and he was found lying on his balcony.  He was awake when he was found but was somewhat confused and has no recollection of the events.  Currently improved.   Past Medical History:  Diagnosis Date  . Diabetes mellitus without complication (HCC)   . TIA (transient ischemic attack)     History reviewed. No pertinent surgical history.  History reviewed. No pertinent family history.  Social History:  reports that he quit smoking about 10 years ago. He has never used smokeless tobacco. He reports that he does not drink alcohol or use drugs.  Allergies  Allergen Reactions  . Statins Other (See Comments)    Other reaction(s): Muscle Pain Abnormal liver & kidney function, muscle break down, confusion  . Penicillin G Hives    Medications: I have reviewed the patient's current medications.  ROS: Unable to obtain due to confusion   Physical Examination: Blood pressure 117/69, pulse 74, temperature 98.3 F (36.8 C), temperature source Oral, resp. rate 18, height 5' 7.5" (1.715 m), weight 85.5 kg, SpO2 99 %.    Neurological Examination   Mental Status: Alert to name but not location  Cranial Nerves: II: Discs flat bilaterally; Visual fields grossly normal, pupils equal, round, reactive to light and accommodation III,IV, VI: ptosis not present, extra-ocular motions intact bilaterally V,VII: smile symmetric, facial light touch sensation normal bilaterally VIII: hearing normal bilaterally IX,X: gag reflex present XI: bilateral shoulder shrug XII: midline tongue extension Motor: 4+/5 bilaterally  Tone and  bulk:normal tone throughout; no atrophy noted Sensory: Pinprick and light touch intact throughout, bilaterally Deep Tendon Reflexes: 1+ and symmetric throughout Plantars: Right: downgoing   Left: downgoing    Laboratory Studies:   Basic Metabolic Panel: Recent Labs  Lab 08/01/19 2134  NA 137  K 4.9  CL 99  CO2 25  GLUCOSE 140*  BUN 30*  CREATININE 1.82*  CALCIUM 10.1    Liver Function Tests: Recent Labs  Lab 08/01/19 2134  AST 35  ALT 17  ALKPHOS 57  BILITOT 1.4*  PROT 8.3*  ALBUMIN 4.5   No results for input(s): LIPASE, AMYLASE in the last 168 hours. No results for input(s): AMMONIA in the last 168 hours.  CBC: Recent Labs  Lab 08/01/19 2134  WBC 8.4  NEUTROABS 7.4  HGB 16.4  HCT 51.7  MCV 94.3  PLT 76*    Cardiac Enzymes: No results for input(s): CKTOTAL, CKMB, CKMBINDEX, TROPONINI in the last 168 hours.  BNP: Invalid input(s): POCBNP  CBG: Recent Labs  Lab 08/01/19 2224 08/02/19 0457 08/02/19 0523 08/02/19 0625 08/02/19 0804  GLUCAP 90 35* 110* 112* 81    Microbiology: Results for orders placed or performed during the hospital encounter of 08/01/19  SARS CORONAVIRUS 2 (TAT 6-24 HRS) Nasopharyngeal Nasopharyngeal Swab     Status: None   Collection Time: 08/01/19 11:36 PM   Specimen: Nasopharyngeal Swab  Result Value Ref Range Status   SARS Coronavirus 2 NEGATIVE NEGATIVE Final    Comment: (NOTE) SARS-CoV-2 target nucleic acids are NOT DETECTED. The SARS-CoV-2 RNA is generally detectable in upper and lower respiratory specimens  during the acute phase of infection. Negative results do not preclude SARS-CoV-2 infection, do not rule out co-infections with other pathogens, and should not be used as the sole basis for treatment or other patient management decisions. Negative results must be combined with clinical observations, patient history, and epidemiological information. The expected result is Negative. Fact Sheet for  Patients: HairSlick.no Fact Sheet for Healthcare Providers: quierodirigir.com This test is not yet approved or cleared by the [place] FDA and  has been authorized for detection and/or diagnosis of SARS-CoV-2 by FDA under an Emergency Use Authorization (EUA). This EUA will remain  in effect (meaning this test can be used) for the duration of the COVID-19 declaration under Section 56 4(b)(1) of the Act, 21 U.S.C. section 360bbb-3(b)(1), unless the authorization is terminated or revoked sooner. Performed at [facility], [address] [place], [place] [address]     Coagulation Studies: No results for input(s): LABPROT, INR in the last 72 hours.  Urinalysis:  Recent Labs  Lab 08/02/19 0509  COLORURINE YELLOW*  LABSPEC 1.019  PHURINE 5.0  GLUCOSEU NEGATIVE  HGBUR NEGATIVE  BILIRUBINUR NEGATIVE  KETONESUR NEGATIVE  PROTEINUR NEGATIVE  NITRITE NEGATIVE  LEUKOCYTESUR NEGATIVE    Lipid Panel:     Component Value Date/Time   CHOL 207 (H) 08/02/2019 1115   TRIG 174 (H) 08/02/2019 1115   HDL 40 (L) 08/02/2019 1115   CHOLHDL 5.2 08/02/2019 1115   VLDL 35 08/02/2019 1115   LDLCALC 132 (H) 08/02/2019 1115    HgbA1C: No results found for: HGBA1C  Urine Drug Screen:  No results found for: LABOPIA, COCAINSCRNUR, LABBENZ, AMPHETMU, THCU, LABBARB  Alcohol Level: No results for input(s): ETH in the last 168 hours.  Other results: EKG: normal EKG, normal sinus rhythm, unchanged from previous tracings.  Imaging: CT HEAD WO CONTRAST  Result Date: 08/01/2019 CLINICAL DATA:  86 year old male status post syncope and fall. EXAM: CT HEAD WITHOUT CONTRAST TECHNIQUE: Contiguous axial images were obtained from the base of the skull through the vertex without intravenous contrast. COMPARISON:  None. FINDINGS: Brain: Ex vacuo appearing ventricular enlargement. No midline shift, ventriculomegaly, mass effect, evidence of mass  lesion, intracranial hemorrhage or evidence of cortically based acute infarction. Patchy and confluent bilateral cerebral white matter hypodensity. Deep gray nuclei appear spared. No cortical encephalomalacia identified. Occasional mild dural calcifications. Vascular: Extensive Calcified atherosclerosis at the skull base. No suspicious intracranial vascular hyperdensity. Skull: No fracture identified. Sinuses/Orbits: Visualized paranasal sinuses and mastoids are clear. Other: No orbit or scalp soft tissue injury identified. IMPRESSION: 1. No acute traumatic injury identified. 2. Confluent cerebral white matter hypodensity, most commonly due to chronic small vessel disease. Electronically Signed   By: Odessa Fleming M.D.   On: 08/01/2019 22:02   CT CERVICAL SPINE WO CONTRAST  Result Date: 08/01/2019 CLINICAL DATA:  86 year old male status post syncope and fall. EXAM: CT CERVICAL SPINE WITHOUT CONTRAST TECHNIQUE: Multidetector CT imaging of the cervical spine was performed without intravenous contrast. Multiplanar CT image reconstructions were also generated. COMPARISON:  Head CT today reported separately. FINDINGS: Alignment: Straightening of cervical lordosis. Cervicothoracic junction alignment is within normal limits. Bilateral posterior element alignment is within normal limits. Skull base and vertebrae: Visualized skull base is intact. No atlanto-occipital dissociation. No acute osseous abnormality identified. Soft tissues and spinal canal: No prevertebral fluid or swelling. No visible canal hematoma. Mild retained secretions in the nasopharynx. Calcified carotid atherosclerosis is bulky on the left. Disc levels: Degenerative subchondral cysts scattered in the  cervical endplates and also in the odontoid process (sagittal image 22). Many of the endplate geodes are vacuum containing. Advanced disc and endplate degeneration at C5-C6 where mild spinal stenosis is suspected. Upper chest: Visible upper thoracic levels  appear intact. Negative lung apices. IMPRESSION: 1.  No acute traumatic injury identified in the cervical spine. 2. Widespread cervical spine degeneration with up to mild spinal stenosis. 3. Bulky calcified atherosclerosis of the left carotid. Electronically Signed   By: Genevie Ann M.D.   On: 08/01/2019 22:06   MR BRAIN WO CONTRAST  Result Date: 08/02/2019 CLINICAL DATA:  Unwitnessed fall, abnormal gait EXAM: MRI HEAD WITHOUT CONTRAST TECHNIQUE: Multiplanar, multiecho pulse sequences of the brain and surrounding structures were obtained without intravenous contrast. COMPARISON:  None. FINDINGS: Brain: There is a small 5 mm focus of reduced diffusion in the region of the left basal ganglia. No evidence of intracranial hemorrhage. Patchy and confluent areas of T2 hyperintensity in the supratentorial white matter are nonspecific but probably reflect moderate chronic microvascular ischemic changes. Prominence of the ventricles and sulci reflects generalized parenchymal volume loss. Relative prominence of the ventricles is likely on an ex vacuo basis. There is no intracranial mass, mass effect, or edema. There is no extra-axial fluid collection. Vascular: Major vessel flow voids at the skull base are preserved. Skull and upper cervical spine: Normal marrow signal is preserved. Sinuses/Orbits: Paranasal sinuses are aerated. Orbits are unremarkable. Other: Sella is unremarkable.  Mastoid air cells are clear. 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 Moderate chronic microvascular ischemic changes. Electronically Signed   By: Macy Mis M.D.   On: 08/02/2019 08:34   US Carotid Bilateral  Result Date: 08/02/2019 CLINICAL DATA:  Syncope and collapse. History of stroke/TIA and diabetes. EXAM: BILATERAL CAROTID DUPLEX ULTRASOUND TECHNIQUE: Pearline Cables scale imaging, color Doppler and duplex ultrasound were performed of bilateral carotid and vertebral arteries in the neck. COMPARISON:  None. FINDINGS:  Criteria: Quantification of carotid stenosis is based on velocity parameters that correlate the residual internal carotid diameter with NASCET-based stenosis levels, using the diameter of the distal internal carotid lumen as the denominator for stenosis measurement. The following velocity measurements were obtained: RIGHT ICA: 80/29 cm/sec CCA: 97/35 cm/sec SYSTOLIC ICA/CCA RATIO:  1.1 ECA: 55 cm/sec LEFT ICA: 85/26 cm/sec CCA: 32/99 cm/sec SYSTOLIC ICA/CCA RATIO:  1.3 ECA: 38 cm/sec RIGHT CAROTID ARTERY: There is a minimal amount of eccentric echogenic plaque within the right carotid bulb (image 25), extending to involve the origin and proximal aspects the right internal carotid artery (image 32), not resulting in elevated peak systolic velocities within the interrogated course of the right internal carotid artery to suggest a hemodynamically significant stenosis. RIGHT VERTEBRAL ARTERY:  Antegrade Flow LEFT CAROTID ARTERY: There is a minimal amount of eccentric echogenic plaque involving the mid aspect the left common carotid artery (image 58). There is a moderate amount of eccentric echogenic shadowing plaque within the left carotid bulb (image 67), extending to involve the origin and proximal aspects of the left internal carotid artery (image 75), not resulting in elevated peak systolic velocities within the interrogated course of the left internal carotid artery to suggest a hemodynamically significant stenosis. LEFT VERTEBRAL ARTERY:  Antegrade Flow IMPRESSION: Minimal to moderate amount of bilateral atherosclerotic plaque, left greater than right, not resulting in a hemodynamically significant stenosis within either internal carotid artery. Electronically Signed   By: Sandi Mariscal M.D.   On: 08/02/2019 08:38   DG Chest Portable 1 View  Result  Date: 08/01/2019 CLINICAL DATA:  Syncopal episode and fall. EXAM: PORTABLE CHEST 1 VIEW COMPARISON:  None. FINDINGS: Mild atelectasis is seen within the right lung  base. There is no evidence of a pleural effusion or pneumothorax. The heart size and mediastinal contours are within normal limits. There is mild calcification of the aortic arch. Degenerative changes seen throughout the bilateral shoulders and thoracic spine. IMPRESSION: No active disease. Electronically Signed   By: Aram Candela M.D.   On: 08/01/2019 22:28     Assessment/Plan:  86 y.o. male  with medical history significant for history of CVA without residual deficit, CAD who presents with an unwitnessed fall.  Last known normal was at 1 PM yesterday.  Around 5 PM which was dinnertime patient did not come to the dining hall and he was found lying on his balcony.  He was awake when he was found but was somewhat confused and has no recollection of the events.  Currently improved -  MRI done and patient has small L BG stroke - He has no focal abnormalities on neurological examination  - Stroke due to HTN and HLD - Stroke in the BG likely not contributing to the current mentation - Full dose ASA and statin - Possibly D/c in AM tomorrow after pt/ot - Out patient neurology follow up  [facility], Annabell Oconnor  08/02/2019, 1:15 PM

## 2019-08-02 NOTE — Progress Notes (Signed)
Inpatient Diabetes Program Recommendations  AACE/ADA: New Consensus Statement on Inpatient Glycemic Control (2015)  Target Ranges:  Prepandial:   less than 140 mg/dL      Peak postprandial:   less than 180 mg/dL (1-2 hours)      Critically ill patients:  140 - 180 mg/dL   Lab Results  Component Value Date   GLUCAP 81 08/02/2019    Review of Glycemic Control Results for Aaron Robles, Aaron Robles (MRN [id number]) as of 08/02/2019 14:40  Ref. Range 08/01/2019 22:24 08/02/2019 04:57 08/02/2019 05:23 08/02/2019 06:25 08/02/2019 08:04  Glucose-Capillary Latest Ref Range: 70 - 99 mg/dL 90 35 (LL) 826 (H) 088 (H) 81   Diabetes history: DM2 Outpatient Diabetes medications: Glucotrol 10 mg bid + Glucophage 500 mg qd Current orders for Inpatient glycemic control: None  Inpatient Diabetes Program Recommendations:   Patient may not need Glucotrol to be restarted on discharge Noted A1c pending.  Thank you, Aaron Robles. Aaron Goranson, RN, MSN, CDE  Diabetes Coordinator Inpatient Glycemic Control Team Team Pager [phone] (8am-5pm) 08/02/2019 2:42 PM

## 2019-08-02 NOTE — Plan of Care (Signed)
Patient admitted to room, no c/o pain, neuro checks performed q4hr. Patient able to get to [facility] with 1x assist however is unsteady on his feet. Bed alarm on, yellow socks on and in place educated to call for assistance. Tolerating diet.   Problem: Education: Goal: Knowledge of General Education information will improve Description: Including pain rating scale, medication(s)/side effects and non-pharmacologic comfort measures Outcome: Progressing   Problem: Activity: Goal: Risk for activity intolerance will decrease Outcome: Progressing   Problem: Nutrition: Goal: Adequate nutrition will be maintained Outcome: Progressing   Problem: Elimination: Goal: Will not experience complications related to urinary retention Outcome: Progressing   Problem: Pain Managment: Goal: General experience of comfort will improve Outcome: Progressing   Problem: Safety: Goal: Ability to remain free from injury will improve Outcome: Progressing

## 2019-08-02 NOTE — ED Notes (Signed)
This nurse spoke to PTs daughter, she reports that she is concerned with stroke for pt. States she was told by individual that pt had R sided weakness. Pt daughter states she will be the main contact for pt with questions and is POA but pt is in control of own care and to call daughter at [phone]

## 2019-08-02 NOTE — ED Notes (Addendum)
Bed ready at 1157. Room 122 1C.

## 2019-08-02 NOTE — ED Notes (Signed)
Patient transported to MRI via stretcher with tech.

## 2019-08-02 NOTE — ED Notes (Signed)
Echo at bedside with PT

## 2019-08-02 NOTE — Progress Notes (Addendum)
PROGRESS NOTE    Aaron Robles  EXN:[id number] DOB: Sep 09, 1931 DOA: 08/01/2019 PCP: System, Pcp Not In    Assessment & Plan:   Principal Problem:   Syncope and collapse Active Problems:   History of CVA (cerebrovascular accident) without residual deficits   Coronary artery disease   AKI (acute kidney injury) (HCC)   Acute CVA (cerebrovascular accident) (HCC)    Aaron Robles is a 86 y.o. 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   # small L BG stroke - no focal abnormalities on neurological examination  - Per neuro consult, "Stroke due to HTN and HLD.  Stroke in the BG likely not contributing to the current mentation." --TTE unremarkable with no shunt.  Carotid US no significant stenosis. PLAN: - Full dose ASA and statin - Possibly D/c in AM tomorrow after pt/ot - Out patient neurology follow up  Fall unwitnessed -Uncertain etiology given negative work-up in the emergency room  --(Addendum: History from daughter, who is a physician :patient who follows at the [place], has a history of shuffling gait and is currently being evaluated for parkinsonism and also has a history of frequent falls, and memory loss following CVA in 2019) --Also says that patient dental procedure on the same day of his admission and she is not sure if he got pain medicine which may have contributed to the fall --Or maybe due to hypoglycemia.  See below PLAN: -Cardiac monitoring    AKI (acute kidney injury) (HCC) --Cr 1.82 on presentation, no known baseline, presumed AKI, however, also could be CKD. 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    Coronary artery disease -No complaints of chest pain, EKG nonacute and troponin 19 -Continue home meds  Type 2 diabetes Hypoglycemia --Had an  episode of hypoglycemia early in the morning with BG 35.  Pt takes glipizide and metformin at home.  Query whether hypoglycemia may have caused the fall at home? PLAN: --Hold all anti-glycemics --BG q4h scheduled to monitor for hypoglycemia -consider d/c glipizide at discharge    DVT prophylaxis: Lovenox SQ Code Status: Full code  Family Communication: updated daughter on the phone today Disposition Plan: Home tomorrow likely with [facility] PT/OT   Subjective and Interval History:  Pt reported feeling better, no complaints.  Did not remember the fall or what happened surrounding the fall.  No fever, dyspnea, chest pain, abdominal pain, N/V/D.  Denied difficulty in swallowing.   Objective: Vitals:   08/02/19 0800 08/02/19 0900 08/02/19 0949 08/02/19 1153  BP: 124/82 128/79 128/79 117/69  Pulse: 73 69 69 74  Resp:   16 18  Temp:   98.2 F (36.8 C) 98.3 F (36.8 C)  TempSrc:   Oral Oral  SpO2: 99% 99% 99% 99%  Weight:      Height:       No intake or output data in the 24 hours ending 08/02/19 1919 Filed Weights   08/01/19 2123  Weight: 85.5 kg    Examination:   Constitutional: NAD, AAOx3 HEENT: conjunctivae and lids normal, EOMI CV: RRR no M,R,G. Distal pulses +2.  No cyanosis.   RESP: CTA B/L, normal respiratory effort  GI: +BS, NTND Extremities: No effusions, edema, or tenderness in BLE SKIN: cool, dry and intact Neuro: II - XII grossly intact.  Sensation intact Psych:  Normal mood and affect.     Data Reviewed: I have personally reviewed following labs and imaging studies  CBC: Recent Labs  Lab 08/01/19 2134  WBC 8.4  NEUTROABS 7.4  HGB 16.4  HCT 51.7  MCV 94.3  PLT 76*   Basic Metabolic Panel: Recent Labs  Lab 08/01/19 2134  NA 137  K 4.9  CL 99  CO2 25  GLUCOSE 140*  BUN 30*  CREATININE 1.82*  CALCIUM 10.1   GFR: Estimated Creatinine Clearance: 30.7 mL/min (A) (by C-G formula based on SCr of 1.82 mg/dL (H)). Liver Function Tests: Recent Labs   Lab 08/01/19 2134  AST 35  ALT 17  ALKPHOS 57  BILITOT 1.4*  PROT 8.3*  ALBUMIN 4.5   No results for input(s): LIPASE, AMYLASE in the last 168 hours. No results for input(s): AMMONIA in the last 168 hours. Coagulation Profile: No results for input(s): INR, PROTIME in the last 168 hours. Cardiac Enzymes: No results for input(s): CKTOTAL, CKMB, CKMBINDEX, TROPONINI in the last 168 hours. BNP (last 3 results) No results for input(s): PROBNP in the last 8760 hours. HbA1C: No results for input(s): HGBA1C in the last 72 hours. CBG: Recent Labs  Lab 08/01/19 2224 08/02/19 0457 08/02/19 0523 08/02/19 0625 08/02/19 0804  GLUCAP 90 35* 110* 112* 81   Lipid Profile: Recent Labs    08/02/19 1115  CHOL 207*  HDL 40*  LDLCALC 132*  TRIG 174*  CHOLHDL 5.2   Thyroid Function Tests: Recent Labs    08/01/19 2134  TSH 0.848   Anemia Panel: No results for input(s): VITAMINB12, FOLATE, FERRITIN, TIBC, IRON, RETICCTPCT in the last 72 hours. Sepsis Labs: No results for input(s): PROCALCITON, LATICACIDVEN in the last 168 hours.  Recent Results (from the past 240 hour(s))  SARS CORONAVIRUS 2 (TAT 6-24 HRS) Nasopharyngeal Nasopharyngeal Swab     Status: None   Collection Time: 08/01/19 11:36 PM   Specimen: Nasopharyngeal Swab  Result Value Ref Range Status   SARS Coronavirus 2 NEGATIVE NEGATIVE Final    Comment: (NOTE) SARS-CoV-2 target nucleic acids are NOT DETECTED. The SARS-CoV-2 RNA is generally detectable in upper and lower respiratory specimens during the acute phase of infection. Negative results do not preclude SARS-CoV-2 infection, do not rule out co-infections with other pathogens, and should not be used as the sole basis for treatment or other patient management decisions. Negative results must be combined with clinical observations, patient history, and epidemiological information. The expected result is Negative. Fact Sheet for  Patients: HairSlick.nohttps://www.fda.gov/media/138098/download Fact Sheet for Healthcare Providers: quierodirigir.comhttps://www.fda.gov/media/138095/download This test is not yet approved or cleared by the [place] FDA and  has been authorized for detection and/or diagnosis of SARS-CoV-2 by FDA under an Emergency Use Authorization (EUA). This EUA will remain  in effect (meaning this test can be used) for the duration of the COVID-19 declaration under Section 56 4(b)(1) of the Act, 21 U.S.C. section 360bbb-3(b)(1), unless the authorization is terminated or revoked sooner. Performed at [facility], [address] [place], [place] [address]       Radiology Studies: CT HEAD WO CONTRAST  Result Date: 08/01/2019 CLINICAL DATA:  86 year old male status post syncope and fall. EXAM: CT HEAD WITHOUT CONTRAST TECHNIQUE: Contiguous axial images were obtained from the base of the skull through the vertex without intravenous contrast. COMPARISON:  None. FINDINGS: Brain: Ex vacuo appearing ventricular enlargement. No midline shift, ventriculomegaly, mass effect, evidence of mass lesion, intracranial hemorrhage or evidence of cortically based acute  infarction. Patchy and confluent bilateral cerebral white matter hypodensity. Deep gray nuclei appear spared. No cortical encephalomalacia identified. Occasional mild dural calcifications. Vascular: Extensive Calcified atherosclerosis at the skull base. No suspicious intracranial vascular hyperdensity. Skull: No fracture identified. Sinuses/Orbits: Visualized paranasal sinuses and mastoids are clear. Other: No orbit or scalp soft tissue injury identified. IMPRESSION: 1. No acute traumatic injury identified. 2. Confluent cerebral white matter hypodensity, most commonly due to chronic small vessel disease. Electronically Signed   By: Odessa Fleming M.D.   On: 08/01/2019 22:02   CT CERVICAL SPINE WO CONTRAST  Result Date: 08/01/2019 CLINICAL DATA:  86 year old male status post syncope and  fall. EXAM: CT CERVICAL SPINE WITHOUT CONTRAST TECHNIQUE: Multidetector CT imaging of the cervical spine was performed without intravenous contrast. Multiplanar CT image reconstructions were also generated. COMPARISON:  Head CT today reported separately. FINDINGS: Alignment: Straightening of cervical lordosis. Cervicothoracic junction alignment is within normal limits. Bilateral posterior element alignment is within normal limits. Skull base and vertebrae: Visualized skull base is intact. No atlanto-occipital dissociation. No acute osseous abnormality identified. Soft tissues and spinal canal: No prevertebral fluid or swelling. No visible canal hematoma. Mild retained secretions in the nasopharynx. Calcified carotid atherosclerosis is bulky on the left. Disc levels: Degenerative subchondral cysts scattered in the cervical endplates and also in the odontoid process (sagittal image 22). Many of the endplate geodes are vacuum containing. Advanced disc and endplate degeneration at C5-C6 where mild spinal stenosis is suspected. Upper chest: Visible upper thoracic levels appear intact. Negative lung apices. IMPRESSION: 1.  No acute traumatic injury identified in the cervical spine. 2. Widespread cervical spine degeneration with up to mild spinal stenosis. 3. Bulky calcified atherosclerosis of the left carotid. Electronically Signed   By: Odessa Fleming M.D.   On: 08/01/2019 22:06   MR BRAIN WO CONTRAST  Result Date: 08/02/2019 CLINICAL DATA:  Unwitnessed fall, abnormal gait EXAM: MRI HEAD WITHOUT CONTRAST TECHNIQUE: Multiplanar, multiecho pulse sequences of the brain and surrounding structures were obtained without intravenous contrast. COMPARISON:  None. FINDINGS: Brain: There is a small 5 mm focus of reduced diffusion in the region of the left basal ganglia. No evidence of intracranial hemorrhage. Patchy and confluent areas of T2 hyperintensity in the supratentorial white matter are nonspecific but probably reflect moderate  chronic microvascular ischemic changes. Prominence of the ventricles and sulci reflects generalized parenchymal volume loss. Relative prominence of the ventricles is likely on an ex vacuo basis. There is no intracranial mass, mass effect, or edema. There is no extra-axial fluid collection. Vascular: Major vessel flow voids at the skull base are preserved. Skull and upper cervical spine: Normal marrow signal is preserved. Sinuses/Orbits: Paranasal sinuses are aerated. Orbits are unremarkable. Other: Sella is unremarkable.  Mastoid air cells are clear. 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 Moderate chronic microvascular ischemic changes. Electronically Signed   By: Guadlupe Spanish M.D.   On: 08/02/2019 08:34   US Carotid Bilateral  Result Date: 08/02/2019 CLINICAL DATA:  Syncope and collapse. History of stroke/TIA and diabetes. EXAM: BILATERAL CAROTID DUPLEX ULTRASOUND TECHNIQUE: Wallace Cullens scale imaging, color Doppler and duplex ultrasound were performed of bilateral carotid and vertebral arteries in the neck. COMPARISON:  None. FINDINGS: Criteria: Quantification of carotid stenosis is based on velocity parameters that correlate the residual internal carotid diameter with NASCET-based stenosis levels, using the diameter of the distal internal carotid lumen as the denominator for stenosis measurement. The following velocity measurements were obtained: RIGHT ICA: 80/29 cm/sec CCA: 76/18 cm/sec  SYSTOLIC ICA/CCA RATIO:  1.1 ECA: 55 cm/sec LEFT ICA: 85/26 cm/sec CCA: 38/25 cm/sec SYSTOLIC ICA/CCA RATIO:  1.3 ECA: 38 cm/sec RIGHT CAROTID ARTERY: There is a minimal amount of eccentric echogenic plaque within the right carotid bulb (image 25), extending to involve the origin and proximal aspects the right internal carotid artery (image 32), not resulting in elevated peak systolic velocities within the interrogated course of the right internal carotid artery to suggest a hemodynamically significant  stenosis. RIGHT VERTEBRAL ARTERY:  Antegrade Flow LEFT CAROTID ARTERY: There is a minimal amount of eccentric echogenic plaque involving the mid aspect the left common carotid artery (image 58). There is a moderate amount of eccentric echogenic shadowing plaque within the left carotid bulb (image 67), extending to involve the origin and proximal aspects of the left internal carotid artery (image 75), not resulting in elevated peak systolic velocities within the interrogated course of the left internal carotid artery to suggest a hemodynamically significant stenosis. LEFT VERTEBRAL ARTERY:  Antegrade Flow IMPRESSION: Minimal to moderate amount of bilateral atherosclerotic plaque, left greater than right, not resulting in a hemodynamically significant stenosis within either internal carotid artery. Electronically Signed   By: Sandi Mariscal M.D.   On: 08/02/2019 08:38   DG Chest Portable 1 View  Result Date: 08/01/2019 CLINICAL DATA:  Syncopal episode and fall. EXAM: PORTABLE CHEST 1 VIEW COMPARISON:  None. FINDINGS: Mild atelectasis is seen within the right lung base. There is no evidence of a pleural effusion or pneumothorax. The heart size and mediastinal contours are within normal limits. There is mild calcification of the aortic arch. Degenerative changes seen throughout the bilateral shoulders and thoracic spine. IMPRESSION: No active disease. Electronically Signed   By: Virgina Norfolk M.D.   On: 08/01/2019 22:28   ECHOCARDIOGRAM COMPLETE  Result Date: 08/02/2019    ECHOCARDIOGRAM REPORT   Patient Name:   Aaron Robles Date of Exam: 08/02/2019 Medical Rec #:  [id number]   Height:       67.5 in Accession #:    [id number]  Weight:       188.5 lb Date of Birth:  [birth year]   BSA:          1.983 m Patient Age:    81 years    BP:           148/89 mmHg Patient Gender: M           HR:           71 bpm. Exam Location:  [facility] Procedure: 2D Echo and Intracardiac Opacification Agent Indications:     R55 Syncope  History:          Patient has no prior history of Echocardiogram examinations.                  Stroke; Risk Factors:Diabetes.  Sonographer:     L Thornton-Maynard Referring Phys:  [id number] Athena Masse Diagnosing Phys: Kate Sable MD  Sonographer Comments: Suboptimal parasternal window. IMPRESSIONS  1. Left ventricular ejection fraction, by estimation, is 55 to 60%. The left ventricle has normal function. The left ventricle has no regional wall motion abnormalities. Left ventricular diastolic parameters are consistent with Grade I diastolic dysfunction (impaired relaxation).  2. Right ventricular systolic function is low normal. The right ventricular size is mildly enlarged. There is normal pulmonary artery systolic pressure.  3. The mitral valve is grossly normal. No evidence of mitral valve regurgitation.  4. The aortic valve is tricuspid. Aortic valve regurgitation is  not visualized. Mild to moderate aortic valve sclerosis/calcification is present, without any evidence of aortic stenosis.  5. The inferior vena cava is normal in size with greater than 50% respiratory variability, suggesting right atrial pressure of 3 mmHg. FINDINGS  Left Ventricle: Left ventricular ejection fraction, by estimation, is 55 to 60%. The left ventricle has normal function. The left ventricle has no regional wall motion abnormalities. Definity contrast agent was given IV to delineate the left ventricular  endocardial borders. The left ventricular internal cavity size was normal in size. There is no left ventricular hypertrophy. Left ventricular diastolic parameters are consistent with Grade I diastolic dysfunction (impaired relaxation). Right Ventricle: The right ventricular size is mildly enlarged. Right vetricular wall thickness was not assessed. Right ventricular systolic function is low normal. There is normal pulmonary artery systolic pressure. The tricuspid regurgitant velocity is  2.39 m/s, and with an assumed right atrial pressure  of 3 mmHg, the estimated right ventricular systolic pressure is 25.8 mmHg. Left Atrium: Left atrial size was normal in size. Right Atrium: Right atrial size was normal in size. Pericardium: There is no evidence of pericardial effusion. Mitral Valve: The mitral valve is grossly normal. No evidence of mitral valve regurgitation. MV peak gradient, 3.5 mmHg. The mean mitral valve gradient is 1.0 mmHg. Tricuspid Valve: The tricuspid valve is normal in structure. Tricuspid valve regurgitation is mild. Aortic Valve: The aortic valve is tricuspid. Aortic valve regurgitation is not visualized. Mild to moderate aortic valve sclerosis/calcification is present, without any evidence of aortic stenosis. Aortic valve mean gradient measures 4.7 mmHg. Aortic valve peak gradient measures 8.8 mmHg. Aortic valve area, by VTI measures 2.33 cm. Pulmonic Valve: The pulmonic valve was normal in structure. Pulmonic valve regurgitation is trivial. Aorta: The aortic root is normal in size and structure. Venous: The inferior vena cava is normal in size with greater than 50% respiratory variability, suggesting right atrial pressure of 3 mmHg. IAS/Shunts: No atrial level shunt detected by color flow Doppler.  LEFT VENTRICLE PLAX 2D LVIDd:         4.48 cm     Diastology LVIDs:         3.44 cm     LV e' lateral:   7.18 cm/s LV PW:         0.90 cm     LV E/e' lateral: 6.4 LV IVS:        0.98 cm     LV e' medial:    4.57 cm/s LVOT diam:     2.20 cm     LV E/e' medial:  10.1 LV SV:         57 LV SV Index:   29 LVOT Area:     3.80 cm  LV Volumes (MOD) LV vol d, MOD A2C: 58.3 ml LV vol d, MOD A4C: 77.7 ml LV vol s, MOD A2C: 18.6 ml LV vol s, MOD A4C: 24.5 ml LV SV MOD A2C:     39.7 ml LV SV MOD A4C:     77.7 ml LV SV MOD BP:      50.3 ml RIGHT VENTRICLE RV S prime:     11.60 cm/s TAPSE (M-mode): 1.5 cm LEFT ATRIUM             Index LA diam:        2.70 cm 1.36 cm/m LA Vol (A2C):   45.0 ml 22.69 ml/m LA Vol (A4C):   33.6 ml 16.95 ml/m LA Biplane  Vol: 41.8 ml 21.08  ml/m  AORTIC VALVE AV Area (Vmax):    2.10 cm AV Area (Vmean):   2.23 cm AV Area (VTI):     2.33 cm AV Vmax:           148.50 cm/s AV Vmean:          100.233 cm/s AV VTI:            0.243 m AV Peak Grad:      8.8 mmHg AV Mean Grad:      4.7 mmHg LVOT Vmax:         82.00 cm/s LVOT Vmean:        58.800 cm/s LVOT VTI:          0.149 m LVOT/AV VTI ratio: 0.61 MITRAL VALVE               TRICUSPID VALVE MV Area (PHT): 2.44 cm    TR Peak grad:   22.8 mmHg MV Peak grad:  3.5 mmHg    TR Vmax:        239.00 cm/s MV Mean grad:  1.0 mmHg MV Vmax:       0.93 m/s    SHUNTS MV Vmean:      48.5 cm/s   Systemic VTI:  0.15 m MV E velocity: 46.10 cm/s  Systemic Diam: 2.20 cm MV A velocity: 98.00 cm/s MV E/A ratio:  0.47 Debbe Odea MD Electronically signed by Debbe Odea MD Signature Date/Time: 08/02/2019/3:05:46 PM    Final      Scheduled Meds: . Melene Muller ON 08/03/2019] enoxaparin (LOVENOX) injection  40 mg Subcutaneous Q24H  . sodium chloride flush  3 mL Intravenous Q12H   Continuous Infusions:   LOS: 0 days     Darlin Priestly, MD [facility] If 7PM-7AM, please contact night-coverage 08/02/2019, 7:19 PM

## 2019-08-03 DIAGNOSIS — E119 Type 2 diabetes mellitus without complications: Secondary | ICD-10-CM

## 2019-08-03 LAB — CBC
HCT: 44.8 % (ref 39.0–52.0)
Hemoglobin: 15 g/dL (ref 13.0–17.0)
MCH: 29.8 pg (ref 26.0–34.0)
MCHC: 33.5 g/dL (ref 30.0–36.0)
MCV: 89.1 fL (ref 80.0–100.0)
Platelets: 188 10*3/uL (ref 150–400)
RBC: 5.03 MIL/uL (ref 4.22–5.81)
RDW: 13.6 % (ref 11.5–15.5)
WBC: 7.7 10*3/uL (ref 4.0–10.5)
nRBC: 0 % (ref 0.0–0.2)

## 2019-08-03 LAB — BASIC METABOLIC PANEL
Anion gap: 12 (ref 5–15)
BUN: 27 mg/dL — ABNORMAL HIGH (ref 8–23)
CO2: 26 mmol/L (ref 22–32)
Calcium: 9.1 mg/dL (ref 8.9–10.3)
Chloride: 98 mmol/L (ref 98–111)
Creatinine, Ser: 1.26 mg/dL — ABNORMAL HIGH (ref 0.61–1.24)
GFR calc Af Amer: 59 mL/min — ABNORMAL LOW (ref >60)
GFR calc non Af Amer: 51 mL/min — ABNORMAL LOW (ref >60)
Glucose, Bld: 98 mg/dL (ref 70–99)
Potassium: 3.9 mmol/L (ref 3.5–5.1)
Sodium: 136 mmol/L (ref 135–145)

## 2019-08-03 LAB — GLUCOSE, CAPILLARY
Glucose-Capillary: 81 mg/dL (ref 70–99)
Glucose-Capillary: 119 mg/dL — ABNORMAL HIGH (ref 70–99)
Glucose-Capillary: 120 mg/dL — ABNORMAL HIGH (ref 70–99)
Glucose-Capillary: 206 mg/dL — ABNORMAL HIGH (ref 70–99)

## 2019-08-03 LAB — HEMOGLOBIN A1C
Hgb A1c MFr Bld: 6.4 % — ABNORMAL HIGH (ref 4.8–5.6)
Mean Plasma Glucose: 137 mg/dL

## 2019-08-03 LAB — MAGNESIUM: Magnesium: 1.7 mg/dL (ref 1.7–2.4)

## 2019-08-03 MED ORDER — ASPIRIN 325 MG PO TABS
325.0000 mg | ORAL_TABLET | Freq: Every day | ORAL | Status: DC
  Administered 2019-08-03 – 2019-08-07 (×5): 325 mg via ORAL
  Filled 2019-08-03 (×5): qty 1

## 2019-08-03 NOTE — Evaluation (Signed)
Physical Therapy Evaluation Patient Details Name: Aaron Robles MRN: [id number] DOB: 1931-06-20 Today's Date: 08/03/2019   History of Present Illness  Pt is 86 y/o M wtih PMH: CVA w/o residual deficits, CAD, AKI, DM, and TIA. Pt was found down on his balcony at [facility] after unwitnessed fall. MRI indicates small acute infarct of L basal ganglia.    Clinical Impression  Pt alert, PT entered with RN. Pt able to state his name, location, and where he lived, unclear if oriented to time/situation. Stated he lives at [facility] living, normally does not need assistance with dressing/bathing, per pt did not ambulate with AD.  The patient demonstrated bed mobility mod I. Sit <> stand with RW and minA for eccentric control to return to sitting safely, and for initial standing balance assist. The patient ambulated ~48ft total. With RW pt exhibited improved safety, CGA. Attempted without RW and handheld assist, pt unable to take a step, unclear if due to fear/poor motor planning. During ambulation significant  shuffling, festination, and freezing of gait noted. Difficulty initiating and terminating ambulation. Upon coordination testing, WNLs while seated, able to follow all commands, generalized LE weakness noted.  Overall the patient demonstrated deficits (see "PT Problem List") that impede the patient's functional abilities, safety, and mobility and would benefit from skilled PT intervention. Recommendation is SNF due to decline in functional status and level of assistance needed.     Follow Up Recommendations SNF    Equipment Recommendations  Rolling walker with 5" wheels    Recommendations for Other Services       Precautions / Restrictions Precautions Precautions: Fall Restrictions Weight Bearing Restrictions: No      Mobility  Bed Mobility Overal bed mobility: Modified Independent             General bed mobility comments: HOB elevated  Transfers Overall transfer  level: Needs assistance Equipment used: Rolling walker (2 wheeled) Transfers: Sit to/from Stand Sit to Stand: Min assist;Min guard         General transfer comment: minA for eccentric control to sit  Ambulation/Gait Ambulation/Gait assistance: Min guard;Min assist Gait Distance (Feet): 25 Feet Assistive device: Rolling walker (2 wheeled)       General Gait Details: significant  shuffling, festination, and freezing of gait noted. Difficulty initiating and terminating ambulation. Unable to ambulate without RW  Stairs            Wheelchair Mobility    Modified Rankin (Stroke Patients Only)       Balance Overall balance assessment: Needs assistance Sitting-balance support: Feet supported Sitting balance-Leahy Scale: Fair       Standing balance-Leahy Scale: Poor                               Pertinent Vitals/Pain Pain Assessment: No/denies pain    Home Living Family/patient expects to be discharged to:: Assisted living Living Arrangements: Alone Available Help at Discharge: Family;Available PRN/intermittently(has dtr in [place] and Son in [place] who check on him) Type of Home: Apartment Home Access: Level entry     Home Layout: One level Home Equipment: Environmental consultant - 4 wheels;Cane - single point Additional Comments: Pt is poor historian, unsure efficacy of some of PLOF information. Pt states he was driving. Information gathered from OT evaluation    Prior Function           Comments: Pt reports he was bathing and dressing himself, states he  has 4WW, but does not use. Facility provides meals and med mgt. Unsure efficacy of some of pt's PLOF information as pt not oriented to month/year.     Hand Dominance        Extremity/Trunk Assessment   Upper Extremity Assessment Upper Extremity Assessment: Defer to OT evaluation;Overall WFL for tasks assessed RUE Coordination: WNL LUE Coordination: WNL    Lower Extremity Assessment Lower Extremity  Assessment: RLE deficits/detail;LLE deficits/detail RLE Deficits / Details: grossly 4-/5 RLE Coordination: WNL LLE Deficits / Details: grossly 4-/5 LLE Coordination: WNL       Communication   Communication: No difficulties  Cognition Arousal/Alertness: Awake/alert Behavior During Therapy: WFL for tasks assessed/performed Overall Cognitive Status: No family/caregiver present to determine baseline cognitive functioning                                 General Comments: oriented to self, place. able to state where he lives. Stated he does not use RW      General Comments      Exercises Other Exercises Other Exercises: use of RW education, hand placement, proper safety during transfers   Assessment/Plan    PT Assessment Patient needs continued PT services  PT Problem List Decreased strength;Decreased mobility;Decreased safety awareness;Decreased activity tolerance;Decreased knowledge of use of DME;Decreased balance       PT Treatment Interventions DME instruction;Therapeutic exercise;Gait training;Balance training;Neuromuscular re-education;Functional mobility training;Therapeutic activities;Patient/family education    PT Goals (Current goals can be found in the Care Plan section)  Acute Rehab PT Goals Patient Stated Goal: to get stronger PT Goal Formulation: With patient Time For Goal Achievement: 08/17/19 Potential to Achieve Goals: Good    Frequency 7X/week   Barriers to discharge Decreased caregiver support      Co-evaluation               AM-PAC PT "6 Clicks" Mobility  Outcome Measure Help needed turning from your back to your side while in a flat bed without using bedrails?: None Help needed moving from lying on your back to sitting on the side of a flat bed without using bedrails?: A Little Help needed moving to and from a bed to a chair (including a wheelchair)?: A Little Help needed standing up from a chair using your arms (e.g.,  wheelchair or bedside chair)?: A Little Help needed to walk in hospital room?: A Little Help needed climbing 3-5 steps with a railing? : Total 6 Click Score: 17    End of Session Equipment Utilized During Treatment: Gait belt Activity Tolerance: Patient tolerated treatment well Patient left: in chair;with chair alarm set;with nursing/sitter in room;with call bell/phone within reach Nurse Communication: Mobility status PT Visit Diagnosis: Other abnormalities of gait and mobility (R26.89);Muscle weakness (generalized) (M62.81);Difficulty in walking, not elsewhere classified (R26.2)    Time: 5329-9242 PT Time Calculation (min) (ACUTE ONLY): 13 min   Charges:   PT Evaluation $PT Eval Low Complexity: 1 Low          Lieutenant Diego PT, DPT 3:18 PM,08/03/19

## 2019-08-03 NOTE — [place] FL2 (Signed)
  [place] MEDICAID FL2 LEVEL OF CARE SCREENING TOOL     IDENTIFICATION  Patient Name: Aaron Robles Birthdate: [birth year] Sex: male Admission Date (Current Location): 08/01/2019  [place] and [place] Number:  Chiropodist and Address:  [facility], [address] [place], [place] [address]      Provider Number: [id number]  Attending Physician Name and Address:  Alberteen Sam, *  Relative Name and Phone Number:  Dr Glenford Peers Hall-daughter [phone]    Current Level of Care: Hospital Recommended Level of Care: Skilled Nursing Facility Prior Approval Number:    Date Approved/Denied:   PASRR Number: [id number]  Discharge Plan: SNF    Current Diagnoses: Patient Active Problem List   Diagnosis Date Noted  . Acute CVA (cerebrovascular accident) (HCC) 08/02/2019  . History of CVA (cerebrovascular accident) without residual deficits 08/01/2019  . Syncope and collapse 08/01/2019  . Coronary artery disease 08/01/2019  . AKI (acute kidney injury) (HCC) 08/01/2019    Orientation RESPIRATION BLADDER Height & Weight     Self, Situation, Place  Normal Continent Weight: 176 lb 12.9 oz (80.2 kg) Height:  5' 7.5" (171.5 cm)  BEHAVIORAL SYMPTOMS/MOOD NEUROLOGICAL BOWEL NUTRITION STATUS      Continent Diet(regular thin liquid diet)  AMBULATORY STATUS COMMUNICATION OF NEEDS Skin   Limited Assist Verbally Normal                       Personal Care Assistance Level of Assistance  Bathing, Dressing Bathing Assistance: Limited assistance   Dressing Assistance: Limited assistance     Functional Limitations Info             SPECIAL CARE FACTORS FREQUENCY  PT (By licensed PT), OT (By licensed OT)     PT Frequency: 5x week OT Frequency: 3x week            Contractures Contractures Info: Not present    Additional Factors Info  Code Status, Allergies Code Status Info: Full Code Allergies Info: Statins, Pencillins            Current Medications (08/03/2019):  This is the current hospital active medication list Current Facility-Administered Medications  Medication Dose Route Frequency Provider Last Rate Last Admin  . enoxaparin (LOVENOX) injection 40 mg  40 mg Subcutaneous Q24H Hallaji, Sheema M, RPH   40 mg at 08/03/19 0008  . sodium chloride flush (NS) 0.9 % injection 3 mL  3 mL Intravenous Q12H Andris Baumann, MD   3 mL at 08/02/19 2208     Discharge Medications: Please see discharge summary for a list of discharge medications.  Relevant Imaging Results:  Relevant Lab Results:   Additional Information [id number]  Lucy Chris, LCSW

## 2019-08-03 NOTE — Progress Notes (Signed)
PROGRESS NOTE    Aaron Robles  KNL:[id number] DOB: [birth year] DOA: 08/01/2019 PCP: System, Pcp Not In      Brief Narrative:  Aaron Robles is a 86 y.o. M with TIA, CAD, DM, and progressive 1 year neurological decline, with shuffling gait, increased weakness, and tremor who presented with unwitnessed fall.  In the ER MRI showed subcentimeter basal ganglia stroke.  Patient was admitted for stroke work-up.       Assessment & Plan:  Left basal ganglia stroke MRI brain on admission showed 84mm L basal ganglia acute infarct. -Echocardiogram showed no cardiogenic source of embolism -Carotid imaging unremarkable   -Patient has history rhabdo with statins, contraindicated -Aspirin ordered at admission --> continue aspirin -Atrial fibrillation: not present on tele -tPA not given because outside window -Dysphagia screen ordered in ER -PT eval ordered: recommended SNF -Smoking cessation: not applicable    Unwitnessed fall Family report that the patient has had progressive shuffling gait, and is being evaluated for parkinsonism, frequent falls, and memory loss over the last 2 years.  Acute kidney injury Creatinine 1.8 on admission, improved to 1.3 with fluids.  Coronary artery disease Hypertension Blood pressure labile here -Hold lisinopril -Continue aspirin, statin  Reported diabetes Patient on glipizide and Metformin at home.  However had severe hypoglycemia here.   -Hold glipizide and Metformin -Continue periodic Accu-Cheks            Disposition: The patient was admitted with fall, found to have incidental acute infarct.  At present, he is significantly debilitated from baseline, needs help with ambulation.  Will need significant rehab treatments to return to his prior level of function (work in independent living, ambulating without walker). I will discharge when safe disposition has been found.        MDM: The below labs and imaging reports were reviewed and  summarized above.  Medication management as above.   DVT prophylaxis: Lovenox Code Status: Full code Family Communication: Daughter by phone    Consultants:   Neurology  Procedures:   4/7 carotid ultrasound  4/7 echocardiogram  4/7 MRI brain  Antimicrobials:      Culture data:              Subjective: Patient is tired, weak.  He has no chest pain, palpitations, focal weakness, numbness, speech disturbance.  Objective: Vitals:   08/03/19 0500 08/03/19 0757 08/03/19 1557 08/03/19 1603  BP:  104/65 (!) 89/54 106/67  Pulse:  76 71 75  Resp: 20 18 15    Temp:  98.1 F (36.7 C) (!) 89.3 F (31.8 C)   TempSrc:  Oral Oral   SpO2:  98% 98%   Weight:  80.2 kg    Height:        Intake/Output Summary (Last 24 hours) at 08/03/2019 1605 Last data filed at 08/03/2019 0236 Gross per 24 hour  Intake --  Output 500 ml  Net -500 ml   Filed Weights   08/01/19 2123 08/03/19 0757  Weight: 85.5 kg 80.2 kg    Examination: General appearance: Elderly adult male, alert and in no acute distress.  Sitting in recliner HEENT: Anicteric, conjunctiva pink, lids and lashes normal. No nasal deformity, discharge, epistaxis.  Lips moist.   Skin: Warm and dry.   No suspicious rashes or lesions. Cardiac: RRR, nl S1-S2, no murmurs appreciated.  Capillary refill is brisk.  JVP normal.  No LE edema.  Radial pulses 2+ and symmetric. Respiratory: Normal respiratory rate and rhythm.  CTAB without rales or  wheezes. Abdomen: Abdomen soft.  No TTP or guarding. No ascites, distension, hepatosplenomegaly.   MSK: No deformities or effusions. Neuro: Awake and alert.  EOMI, moves upper and lower extremities with 4/5 strength, symmetric. Speech fluent.    Psych: Sensorium intact and responding to questions, attention normal. Affect blunted.  Judgment and insight appear moderately impaired.    Data Reviewed: I have personally reviewed following labs and imaging studies:  CBC: Recent Labs  Lab  2019-08-30 2134 08/03/19 0509  WBC 8.4 7.7  NEUTROABS 7.4  --   HGB 16.4 15.0  HCT 51.7 44.8  MCV 94.3 89.1  PLT 76* 188   Basic Metabolic Panel: Recent Labs  Lab Aug 30, 2019 2134 08/03/19 0509  NA 137 136  K 4.9 3.9  CL 99 98  CO2 25 26  GLUCOSE 140* 98  BUN 30* 27*  CREATININE 1.82* 1.26*  CALCIUM 10.1 9.1  MG  --  1.7   GFR: Estimated Creatinine Clearance: 40.1 mL/min (A) (by C-G formula based on SCr of 1.26 mg/dL (H)). Liver Function Tests: Recent Labs  Lab 2019-08-30 2134  AST 35  ALT 17  ALKPHOS 57  BILITOT 1.4*  PROT 8.3*  ALBUMIN 4.5   No results for input(s): LIPASE, AMYLASE in the last 168 hours. No results for input(s): AMMONIA in the last 168 hours. Coagulation Profile: No results for input(s): INR, PROTIME in the last 168 hours. Cardiac Enzymes: No results for input(s): CKTOTAL, CKMB, CKMBINDEX, TROPONINI in the last 168 hours. BNP (last 3 results) No results for input(s): PROBNP in the last 8760 hours. HbA1C: Recent Labs    Aug 30, 2019 2134  HGBA1C 6.4*   CBG: Recent Labs  Lab 08/02/19 2011 08/03/19 0349 08/03/19 0755 08/03/19 1158 08/03/19 1553  GLUCAP 141* 81 119* 120* 206*   Lipid Profile: Recent Labs    08/02/19 1115  CHOL 207*  HDL 40*  LDLCALC 132*  TRIG 174*  CHOLHDL 5.2   Thyroid Function Tests: Recent Labs    08/30/19 2134  TSH 0.848   Anemia Panel: No results for input(s): VITAMINB12, FOLATE, FERRITIN, TIBC, IRON, RETICCTPCT in the last 72 hours. Urine analysis:    Component Value Date/Time   COLORURINE YELLOW (A) 08/02/2019 0509   APPEARANCEUR HAZY (A) 08/02/2019 0509   LABSPEC 1.019 08/02/2019 0509   PHURINE 5.0 08/02/2019 0509   GLUCOSEU NEGATIVE 08/02/2019 0509   HGBUR NEGATIVE 08/02/2019 0509   BILIRUBINUR NEGATIVE 08/02/2019 0509   KETONESUR NEGATIVE 08/02/2019 0509   PROTEINUR NEGATIVE 08/02/2019 0509   NITRITE NEGATIVE 08/02/2019 0509   LEUKOCYTESUR NEGATIVE 08/02/2019 0509   Sepsis  Labs: (procalcitonin:4,lacticacidven:4)  ) Recent Results (from the past 240 hour(s))  SARS CORONAVIRUS 2 (TAT 6-24 HRS) Nasopharyngeal Nasopharyngeal Swab     Status: None   Collection Time: 08/30/2019 11:36 PM   Specimen: Nasopharyngeal Swab  Result Value Ref Range Status   SARS Coronavirus 2 NEGATIVE NEGATIVE Final    Comment: (NOTE) SARS-CoV-2 target nucleic acids are NOT DETECTED. The SARS-CoV-2 RNA is generally detectable in upper and lower respiratory specimens during the acute phase of infection. Negative results do not preclude SARS-CoV-2 infection, do not rule out co-infections with other pathogens, and should not be used as the sole basis for treatment or other patient management decisions. Negative results must be combined with clinical observations, patient history, and epidemiological information. The expected result is Negative. Fact Sheet for Patients: HairSlick.no Fact Sheet for Healthcare Providers: quierodirigir.com This test is not yet approved or cleared by the [place] FDA and  has been authorized for detection and/or diagnosis of SARS-CoV-2 by FDA under an Emergency Use Authorization (EUA). This EUA will remain  in effect (meaning this test can be used) for the duration of the COVID-19 declaration under Section 56 4(b)(1) of the Act, 21 U.S.C. section 360bbb-3(b)(1), unless the authorization is terminated or revoked sooner. Performed at [facility], [address] [place], [place] [address]          Radiology Studies: CT HEAD WO CONTRAST  Result Date: 08/01/2019 CLINICAL DATA:  86 year old male status post syncope and fall. EXAM: CT HEAD WITHOUT CONTRAST TECHNIQUE: Contiguous axial images were obtained from the base of the skull through the vertex without intravenous contrast. COMPARISON:  None. FINDINGS: Brain: Ex vacuo appearing ventricular enlargement. No midline shift,  ventriculomegaly, mass effect, evidence of mass lesion, intracranial hemorrhage or evidence of cortically based acute infarction. Patchy and confluent bilateral cerebral white matter hypodensity. Deep gray nuclei appear spared. No cortical encephalomalacia identified. Occasional mild dural calcifications. Vascular: Extensive Calcified atherosclerosis at the skull base. No suspicious intracranial vascular hyperdensity. Skull: No fracture identified. Sinuses/Orbits: Visualized paranasal sinuses and mastoids are clear. Other: No orbit or scalp soft tissue injury identified. IMPRESSION: 1. No acute traumatic injury identified. 2. Confluent cerebral white matter hypodensity, most commonly due to chronic small vessel disease. Electronically Signed   By: Odessa FlemingH  Hall M.D.   On: 08/01/2019 22:02   CT CERVICAL SPINE WO CONTRAST  Result Date: 08/01/2019 CLINICAL DATA:  86 year old male status post syncope and fall. EXAM: CT CERVICAL SPINE WITHOUT CONTRAST TECHNIQUE: Multidetector CT imaging of the cervical spine was performed without intravenous contrast. Multiplanar CT image reconstructions were also generated. COMPARISON:  Head CT today reported separately. FINDINGS: Alignment: Straightening of cervical lordosis. Cervicothoracic junction alignment is within normal limits. Bilateral posterior element alignment is within normal limits. Skull base and vertebrae: Visualized skull base is intact. No atlanto-occipital dissociation. No acute osseous abnormality identified. Soft tissues and spinal canal: No prevertebral fluid or swelling. No visible canal hematoma. Mild retained secretions in the nasopharynx. Calcified carotid atherosclerosis is bulky on the left. Disc levels: Degenerative subchondral cysts scattered in the cervical endplates and also in the odontoid process (sagittal image 22). Many of the endplate geodes are vacuum containing. Advanced disc and endplate degeneration at C5-C6 where mild spinal stenosis is suspected.  Upper chest: Visible upper thoracic levels appear intact. Negative lung apices. IMPRESSION: 1.  No acute traumatic injury identified in the cervical spine. 2. Widespread cervical spine degeneration with up to mild spinal stenosis. 3. Bulky calcified atherosclerosis of the left carotid. Electronically Signed   By: Odessa FlemingH  Hall M.D.   On: 08/01/2019 22:06   MR BRAIN WO CONTRAST  Result Date: 08/02/2019 CLINICAL DATA:  Unwitnessed fall, abnormal gait EXAM: MRI HEAD WITHOUT CONTRAST TECHNIQUE: Multiplanar, multiecho pulse sequences of the brain and surrounding structures were obtained without intravenous contrast. COMPARISON:  None. FINDINGS: Brain: There is a small 5 mm focus of reduced diffusion in the region of the left basal ganglia. No evidence of intracranial hemorrhage. Patchy and confluent areas of T2 hyperintensity in the supratentorial white matter are nonspecific but probably reflect moderate chronic microvascular ischemic changes. Prominence of the ventricles and sulci reflects generalized parenchymal volume loss. Relative prominence of the ventricles is likely on an ex vacuo basis. There is no intracranial mass, mass effect, or edema. There is no extra-axial fluid collection. Vascular: Major vessel flow voids at the skull base are preserved. Skull  and upper cervical spine: Normal marrow signal is preserved. Sinuses/Orbits: Paranasal sinuses are aerated. Orbits are unremarkable. Other: Sella is unremarkable.  Mastoid air cells are clear. 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 Moderate chronic microvascular ischemic changes. Electronically Signed   By: Guadlupe Spanish M.D.   On: 08/02/2019 08:34   US Carotid Bilateral  Result Date: 08/02/2019 CLINICAL DATA:  Syncope and collapse. History of stroke/TIA and diabetes. EXAM: BILATERAL CAROTID DUPLEX ULTRASOUND TECHNIQUE: Wallace Cullens scale imaging, color Doppler and duplex ultrasound were performed of bilateral carotid and vertebral arteries in the  neck. COMPARISON:  None. FINDINGS: Criteria: Quantification of carotid stenosis is based on velocity parameters that correlate the residual internal carotid diameter with NASCET-based stenosis levels, using the diameter of the distal internal carotid lumen as the denominator for stenosis measurement. The following velocity measurements were obtained: RIGHT ICA: 80/29 cm/sec CCA: 76/18 cm/sec SYSTOLIC ICA/CCA RATIO:  1.1 ECA: 55 cm/sec LEFT ICA: 85/26 cm/sec CCA: 64/17 cm/sec SYSTOLIC ICA/CCA RATIO:  1.3 ECA: 38 cm/sec RIGHT CAROTID ARTERY: There is a minimal amount of eccentric echogenic plaque within the right carotid bulb (image 25), extending to involve the origin and proximal aspects the right internal carotid artery (image 32), not resulting in elevated peak systolic velocities within the interrogated course of the right internal carotid artery to suggest a hemodynamically significant stenosis. RIGHT VERTEBRAL ARTERY:  Antegrade Flow LEFT CAROTID ARTERY: There is a minimal amount of eccentric echogenic plaque involving the mid aspect the left common carotid artery (image 58). There is a moderate amount of eccentric echogenic shadowing plaque within the left carotid bulb (image 67), extending to involve the origin and proximal aspects of the left internal carotid artery (image 75), not resulting in elevated peak systolic velocities within the interrogated course of the left internal carotid artery to suggest a hemodynamically significant stenosis. LEFT VERTEBRAL ARTERY:  Antegrade Flow IMPRESSION: Minimal to moderate amount of bilateral atherosclerotic plaque, left greater than right, not resulting in a hemodynamically significant stenosis within either internal carotid artery. Electronically Signed   By: Simonne Come M.D.   On: 08/02/2019 08:38   DG Chest Portable 1 View  Result Date: 08/01/2019 CLINICAL DATA:  Syncopal episode and fall. EXAM: PORTABLE CHEST 1 VIEW COMPARISON:  None. FINDINGS: Mild  atelectasis is seen within the right lung base. There is no evidence of a pleural effusion or pneumothorax. The heart size and mediastinal contours are within normal limits. There is mild calcification of the aortic arch. Degenerative changes seen throughout the bilateral shoulders and thoracic spine. IMPRESSION: No active disease. Electronically Signed   By: Aram Candela M.D.   On: 08/01/2019 22:28   ECHOCARDIOGRAM COMPLETE  Result Date: 08/02/2019    ECHOCARDIOGRAM REPORT   Patient Name:   Aaron Robles Date of Exam: 08/02/2019 Medical Rec #:  [id number]   Height:       67.5 in Accession #:    [id number]  Weight:       188.5 lb Date of Birth:  1931-08-24   BSA:          1.983 m Patient Age:    88 years    BP:           148/89 mmHg Patient Gender: M           HR:           71 bpm. Exam Location:  [facility] Procedure: 2D Echo and Intracardiac Opacification Agent Indications:     R55 Syncope  History:         Patient has no prior history of Echocardiogram examinations.                  Stroke; Risk Factors:Diabetes.  Sonographer:     L Thornton-Maynard Referring Phys:  [id number] Andris Baumann Diagnosing Phys: Debbe Odea MD  Sonographer Comments: Suboptimal parasternal window. IMPRESSIONS  1. Left ventricular ejection fraction, by estimation, is 55 to 60%. The left ventricle has normal function. The left ventricle has no regional wall motion abnormalities. Left ventricular diastolic parameters are consistent with Grade I diastolic dysfunction (impaired relaxation).  2. Right ventricular systolic function is low normal. The right ventricular size is mildly enlarged. There is normal pulmonary artery systolic pressure.  3. The mitral valve is grossly normal. No evidence of mitral valve regurgitation.  4. The aortic valve is tricuspid. Aortic valve regurgitation is not visualized. Mild to moderate aortic valve sclerosis/calcification is present, without any evidence of aortic stenosis.  5. The inferior vena cava is  normal in size with greater than 50% respiratory variability, suggesting right atrial pressure of 3 mmHg. FINDINGS  Left Ventricle: Left ventricular ejection fraction, by estimation, is 55 to 60%. The left ventricle has normal function. The left ventricle has no regional wall motion abnormalities. Definity contrast agent was given IV to delineate the left ventricular  endocardial borders. The left ventricular internal cavity size was normal in size. There is no left ventricular hypertrophy. Left ventricular diastolic parameters are consistent with Grade I diastolic dysfunction (impaired relaxation). Right Ventricle: The right ventricular size is mildly enlarged. Right vetricular wall thickness was not assessed. Right ventricular systolic function is low normal. There is normal pulmonary artery systolic pressure. The tricuspid regurgitant velocity is  2.39 m/s, and with an assumed right atrial pressure of 3 mmHg, the estimated right ventricular systolic pressure is 25.8 mmHg. Left Atrium: Left atrial size was normal in size. Right Atrium: Right atrial size was normal in size. Pericardium: There is no evidence of pericardial effusion. Mitral Valve: The mitral valve is grossly normal. No evidence of mitral valve regurgitation. MV peak gradient, 3.5 mmHg. The mean mitral valve gradient is 1.0 mmHg. Tricuspid Valve: The tricuspid valve is normal in structure. Tricuspid valve regurgitation is mild. Aortic Valve: The aortic valve is tricuspid. Aortic valve regurgitation is not visualized. Mild to moderate aortic valve sclerosis/calcification is present, without any evidence of aortic stenosis. Aortic valve mean gradient measures 4.7 mmHg. Aortic valve peak gradient measures 8.8 mmHg. Aortic valve area, by VTI measures 2.33 cm. Pulmonic Valve: The pulmonic valve was normal in structure. Pulmonic valve regurgitation is trivial. Aorta: The aortic root is normal in size and structure. Venous: The inferior vena cava is normal  in size with greater than 50% respiratory variability, suggesting right atrial pressure of 3 mmHg. IAS/Shunts: No atrial level shunt detected by color flow Doppler.  LEFT VENTRICLE PLAX 2D LVIDd:         4.48 cm     Diastology LVIDs:         3.44 cm     LV e' lateral:   7.18 cm/s LV PW:         0.90 cm     LV E/e' lateral: 6.4 LV IVS:        0.98 cm     LV e' medial:    4.57 cm/s LVOT diam:     2.20 cm     LV E/e' medial:  10.1 LV SV:  57 LV SV Index:   29 LVOT Area:     3.80 cm  LV Volumes (MOD) LV vol d, MOD A2C: 58.3 ml LV vol d, MOD A4C: 77.7 ml LV vol s, MOD A2C: 18.6 ml LV vol s, MOD A4C: 24.5 ml LV SV MOD A2C:     39.7 ml LV SV MOD A4C:     77.7 ml LV SV MOD BP:      50.3 ml RIGHT VENTRICLE RV S prime:     11.60 cm/s TAPSE (M-mode): 1.5 cm LEFT ATRIUM             Index LA diam:        2.70 cm 1.36 cm/m LA Vol (A2C):   45.0 ml 22.69 ml/m LA Vol (A4C):   33.6 ml 16.95 ml/m LA Biplane Vol: 41.8 ml 21.08 ml/m  AORTIC VALVE AV Area (Vmax):    2.10 cm AV Area (Vmean):   2.23 cm AV Area (VTI):     2.33 cm AV Vmax:           148.50 cm/s AV Vmean:          100.233 cm/s AV VTI:            0.243 m AV Peak Grad:      8.8 mmHg AV Mean Grad:      4.7 mmHg LVOT Vmax:         82.00 cm/s LVOT Vmean:        58.800 cm/s LVOT VTI:          0.149 m LVOT/AV VTI ratio: 0.61 MITRAL VALVE               TRICUSPID VALVE MV Area (PHT): 2.44 cm    TR Peak grad:   22.8 mmHg MV Peak grad:  3.5 mmHg    TR Vmax:        239.00 cm/s MV Mean grad:  1.0 mmHg MV Vmax:       0.93 m/s    SHUNTS MV Vmean:      48.5 cm/s   Systemic VTI:  0.15 m MV E velocity: 46.10 cm/s  Systemic Diam: 2.20 cm MV A velocity: 98.00 cm/s MV E/A ratio:  0.47 Debbe Odea MD Electronically signed by Debbe Odea MD Signature Date/Time: 08/02/2019/3:05:46 PM    Final         Scheduled Meds: . enoxaparin (LOVENOX) injection  40 mg Subcutaneous Q24H  . sodium chloride flush  3 mL Intravenous Q12H   Continuous Infusions:   LOS: 1 day     Time spent: 25 minutes    Alberteen Sam, MD [facility] 08/03/2019, 4:05 PM     Please page though AMION or Epic secure chat:  For [employer], Higher education careers adviser

## 2019-08-03 NOTE — Progress Notes (Signed)
PT Cancellation Note  Patient Details Name: Aaron Robles MRN: [id number] DOB: [birth year]   Cancelled Treatment:    Reason Eval/Treat Not Completed: Other (comment). Pt initially opened his eyes for PT upon entering room, but quickly closed his eyes, told the PT to "do what you gotta do". Despite multimodal cueing, adjustment of blankets, lights on, and repositioning of LEs in bed, pt would not speak/acknowledge PT. PT to re-attempt as able.   Olga Coaster PT, DPT 10:54 AM,08/03/19

## 2019-08-03 NOTE — Plan of Care (Signed)
Pt's ability to follow instructions on last [facility] evaluation has declined.  Doesn't understand how to extend arms straight out and when asked to raise his leg it crossed over the other leg.

## 2019-08-03 NOTE — Plan of Care (Addendum)
Respiratory Rate has resulted in yellow mews 2.  Throughout admission he has had periods of respiratory rate > 24 but not higher than 28.  Pt is sleeping and looks comfortable and has had no c/o of pain. Discussed with Brandi, CN.   Is + for stroke L basal ganglia - is having movement and posture issues.

## 2019-08-04 LAB — BASIC METABOLIC PANEL
Anion gap: 9 (ref 5–15)
BUN: 32 mg/dL — ABNORMAL HIGH (ref 8–23)
CO2: 26 mmol/L (ref 22–32)
Calcium: 9.1 mg/dL (ref 8.9–10.3)
Chloride: 99 mmol/L (ref 98–111)
Creatinine, Ser: 1.36 mg/dL — ABNORMAL HIGH (ref 0.61–1.24)
GFR calc Af Amer: 54 mL/min — ABNORMAL LOW (ref >60)
GFR calc non Af Amer: 47 mL/min — ABNORMAL LOW (ref >60)
Glucose, Bld: 124 mg/dL — ABNORMAL HIGH (ref 70–99)
Potassium: 4.7 mmol/L (ref 3.5–5.1)
Sodium: 134 mmol/L — ABNORMAL LOW (ref 135–145)

## 2019-08-04 LAB — CBC
HCT: 43.3 % (ref 39.0–52.0)
Hemoglobin: 14.5 g/dL (ref 13.0–17.0)
MCH: 29.7 pg (ref 26.0–34.0)
MCHC: 33.5 g/dL (ref 30.0–36.0)
MCV: 88.5 fL (ref 80.0–100.0)
Platelets: 196 10*3/uL (ref 150–400)
RBC: 4.89 MIL/uL (ref 4.22–5.81)
RDW: 13.4 % (ref 11.5–15.5)
WBC: 6.8 10*3/uL (ref 4.0–10.5)
nRBC: 0 % (ref 0.0–0.2)

## 2019-08-04 LAB — MAGNESIUM: Magnesium: 1.8 mg/dL (ref 1.7–2.4)

## 2019-08-04 LAB — GLUCOSE, CAPILLARY: Glucose-Capillary: 92 mg/dL (ref 70–99)

## 2019-08-04 NOTE — TOC Progression Note (Signed)
Transition of Care [facility]) - Progression Note    Patient Details  Name: Aaron Robles MRN: [id number] Date of Birth: 1931-10-24  Transition of Care [facility]) CM/SW Contact  Konstance Happel, Lemar Livings, LCSW Phone Number: 08/04/2019, 4:23 PM  Clinical Narrative: [facility] has offered a bed and [facility] still awaiting decision. Daughter would really like to go to [facility] due to closer to her. Should hear back today regarding decision. Bed not available until Monday for both facilities. Awaiting return from [facility]. Daughter aware of all of this and so is pt. Will follow up with once hear back from both. Will need new COVID test 24-48 hr prior to transfer.         Expected Discharge Plan and Services                                                 Social Determinants of Health (SDOH) Interventions    Readmission Risk Interventions No flowsheet data found.

## 2019-08-04 NOTE — TOC Progression Note (Addendum)
Transition of Care [facility]) - Progression Note    Patient Details  Name: Aaron Robles MRN: [id number] Date of Birth: [birth year]  Transition of Care [facility]) CM/SW Contact  Shaquill Iseman, Lemar Livings, LCSW Phone Number: 08/04/2019, 9:21 AM  Clinical Narrative: Awaiting daughter's return call in a meeting. Will discuss discharge disposition    10:20 am Spoke with daughter who is a MD in [place] and would like to look ion this area due to pt's daughter in-law has cancer and his son is busy with her and not able to be as involved. Have reached out to [facility] and [facility]. Daughter to get worker a few more to pursue. Looking for SNF bed in [place]   11:30 AM Daughter wants to pursue [facility] for his rehab and have reached out to admissions there to see if received the information sent to them    Expected Discharge Plan and Services                                                 Social Determinants of Health (SDOH) Interventions    Readmission Risk Interventions No flowsheet data found.

## 2019-08-04 NOTE — Progress Notes (Addendum)
Physical Therapy Treatment Patient Details Name: Aaron Robles MRN: [id number] DOB: [birth year] Today's Date: 08/04/2019    History of Present Illness Pt is 86 y/o M wtih PMH: CVA w/o residual deficits, CAD, AKI, DM, and TIA. Pt was found down on his balcony at [place] after unwitnessed fall. MRI indicates small acute infarct of L basal ganglia.    PT Comments    Patient alert, agreeable to work with PT, denied pain. The patient demonstrated bed mobility with CGA, extra effort needed to complete, CGA to help maintain balance once in sitting initially. The patient performed seated exercises, but needed step by step sequencing and demonstration;difficulty terminating exercises/completing full ROM. Sit <> stand RW and CGA/minA (one instance of minA for standing balance due to posterior lean). Pt began to urinate on the floor, assisted to sit. In sitting pt able to doff socks with supervision and clean LEs. maxA to don socks. Pt ambulated >64ft with RW and CGA, exhibited significant shuffled step, festination, and freezing. Without UE support in standing pt with posterior lean that required minA to correct to prevent a fall. Pt up in chair at end of session, all needs in reach and set up for dinner. Current recommendation remains appropriate due to level of assistance needed and decline in functional status.    Follow Up Recommendations  SNF     Equipment Recommendations  Rolling walker with 5" wheels    Recommendations for Other Services       Precautions / Restrictions Precautions Precautions: Fall Restrictions Weight Bearing Restrictions: No    Mobility  Bed Mobility Overal bed mobility: Needs Assistance Bed Mobility: Supine to Sit       Sit to supine: Min guard;HOB elevated   General bed mobility comments: extended time needed, extensive use of UEs  Transfers Overall transfer level: Needs assistance Equipment used: Rolling walker (2 wheeled) Transfers: Sit to/from  Stand Sit to Stand: Min guard         General transfer comment: minA for eccentric control to sit. During one transfer pt needed minA for initial standing balance.  Ambulation/Gait Ambulation/Gait assistance: Min guard Gait Distance (Feet): (>35ft) Assistive device: Rolling walker (2 wheeled)       General Gait Details: significant  shuffling, festination, and freezing of gait noted. Difficulty initiating and terminating ambulation. Unable to ambulate without RW. Pt did take his hands off the walker to don mask, minA to maintain balance due to mild posterior lean, pt unable to correct without assistance.   Stairs             Wheelchair Mobility    Modified Rankin (Stroke Patients Only)       Balance Overall balance assessment: Needs assistance Sitting-balance support: Feet supported Sitting balance-Leahy Scale: Fair     Standing balance support: Bilateral upper extremity supported Standing balance-Leahy Scale: Poor                              Cognition Arousal/Alertness: Awake/alert Behavior During Therapy: WFL for tasks assessed/performed Overall Cognitive Status: No family/caregiver present to determine baseline cognitive functioning                                 General Comments: Demonstrated difficulty following 1 step commands and poor safety awareness / carryover of training       Exercises Other Exercises Other Exercises: seated marching,  LAQ, and ankle/toe pumps. Pt cued for step by step sequencing and full ROM. Difficulty with termination of exercises. Other Exercises: Pt urinated on teh floor while ambulating. Once seated on bed, pt assisted minA for sock doffing, LE cleaning, and maxA for sock donning    General Comments        Pertinent Vitals/Pain Pain Assessment: No/denies pain    Home Living                      Prior Function            PT Goals (current goals can now be found in the care plan  section) Progress towards PT goals: Progressing toward goals    Frequency    7X/week      PT Plan Current plan remains appropriate    Co-evaluation              AM-PAC PT "6 Clicks" Mobility   Outcome Measure  Help needed turning from your back to your side while in a flat bed without using bedrails?: None Help needed moving from lying on your back to sitting on the side of a flat bed without using bedrails?: A Little Help needed moving to and from a bed to a chair (including a wheelchair)?: A Little Help needed standing up from a chair using your arms (e.g., wheelchair or bedside chair)?: A Little Help needed to walk in hospital room?: A Little Help needed climbing 3-5 steps with a railing? : Total 6 Click Score: 17    End of Session Equipment Utilized During Treatment: Gait belt Activity Tolerance: Patient tolerated treatment well Patient left: in chair;with chair alarm set;with nursing/sitter in room;with call bell/phone within reach Nurse Communication: Mobility status PT Visit Diagnosis: Other abnormalities of gait and mobility (R26.89);Muscle weakness (generalized) (M62.81);Difficulty in walking, not elsewhere classified (R26.2)     Time: 0786-7544 PT Time Calculation (min) (ACUTE ONLY): 22 min  Charges:  $Therapeutic Exercise: 8-22 mins $Therapeutic Activity: 8-22 mins                     Olga Coaster PT, DPT 6:01 PM,08/04/19

## 2019-08-04 NOTE — TOC Progression Note (Addendum)
Transition of Care [facility]) - Progression Note    Patient Details  Name: Aaron Robles MRN: [id number] Date of Birth: 06-Nov-1931  Transition of Care [facility]) CM/SW Contact  Dnyla Antonetti, Lemar Livings, LCSW Phone Number: 08/04/2019, 2:31 PM  Clinical Narrative:  Have spoke with daughter again and she wants to pursue [facility] and [facility] for a bed. Have faxed his information to all of the above facilities. Pt will need new COVID test once bed found for him. Pt does need to stay for three nights  due to Medicare requirement          Expected Discharge Plan and Services                                                 Social Determinants of Health (SDOH) Interventions    Readmission Risk Interventions No flowsheet data found.

## 2019-08-04 NOTE — Progress Notes (Signed)
PROGRESS NOTE    Aaron Robles  TAV:[id number] DOB: [birth year] DOA: 08/01/2019 PCP: System, Pcp Not In      Brief Narrative:  Aaron Robles is a 86 y.o. M with TIA, CAD, DM, and progressive 1 year neurological decline, with shuffling gait, increased weakness, and tremor who presented with unwitnessed fall.  In the ER MRI showed subcentimeter basal ganglia stroke.  Patient was admitted for stroke work-up.       Assessment & Plan:  Left basal ganglia stroke MRI brain on admission showed 34mm L basal ganglia acute infarct. -Echocardiogram showed no cardiogenic source of embolism -Carotid imaging unremarkable   -Patient has history rhabdo with statins, contraindicated -Aspirin ordered at admission --> continue aspirin 325 -Follow up with Neurology -Atrial fibrillation: not present on tele -tPA not given because outside window -Dysphagia screen ordered in ER -PT eval ordered: recommended SNF -Smoking cessation: not applicable    Unwitnessed fall Progressive weakness, memory loss Family report that the patient has had progressive shuffling gait, withdrawn personality, decreased function and maybe tremor.  He is being evaluated for parkinsonism, frequent falls, and memory loss over the last 2 years. -Follow up with Neurology  Acute kidney injury Creatinine 1.8 on admission, improved to 1.3 with fluids.  Coronary artery disease Hypertension Blood pressure controlled here off meds -Hold lisinopril -Continue aspirin  Reported diabetes Patient on glipizide and Metformin at home.  However had severe hypoglycemia here. A1c 6.4%, adequate.  -Hold glipizide and Metformin -Continue periodic Accu-Cheks            Disposition: The patient was admitted after a fall, found to have acute stroke.   Although he is fully independent at baseline, lives in independent living, ambulates without a walker, and is independent for all self-cares, at present he needs help with ambulation,  requires walker, and is severely generally weak.  He will need extensive rehabilitation to return to his prior level of function.  At present we do not have a safe disposition and are working with insurance to arrange skilled nursing rehab.       MDM: The below labs and imaging reports reviewed and summarized above.  Medication management as above.     DVT prophylaxis: Lovenox Code Status: Full code Family Communication: attempted to reach Daughter by phone, no ansewr, VM full, will attempt again tomorrow    Consultants:   Neurology  Procedures:   4/7 carotid ultrasound  4/7 echocardiogram  4/7 MRI brain  Antimicrobials:      Culture data:              Subjective: No new complaints.  No fever overnight, no confusion, palpitations, chest pain.  No focal weakness.  No speech changes.  No respiratory distress.       Objective: Vitals:   08/03/19 1627 08/04/19 0014 08/04/19 0505 08/04/19 0825  BP:  136/84  110/73  Pulse:  78  67  Resp:  17  17  Temp: 98.3 F (36.8 C) 98.2 F (36.8 C)  98.5 F (36.9 C)  TempSrc: Oral     SpO2:  100%  100%  Weight:   81.8 kg   Height:        Intake/Output Summary (Last 24 hours) at 08/04/2019 1511 Last data filed at 08/04/2019 1040 Gross per 24 hour  Intake 240 ml  Output 400 ml  Net -160 ml   Filed Weights   08/01/19 2123 08/03/19 0757 08/04/19 0505  Weight: 85.5 kg 80.2 kg 81.8 kg  Examination: General appearance: Elderly adult male, lying in bed, interactive, no acute distress HEENT: Anicteric, conjunctival pink, lids and lashes normal for age.  No nasal deformity, discharge, or epistaxis.  Lips moist, dentition in good repair, oropharynx tacky dry, no oral lesions, hearing diminished Skin: Warm and dry, no suspicious rashes or lesions Cardiac: Regular rate and rhythm, soft systolic murmur, JVP normal, no lower extremity edema Respiratory: Normal respiratory rate and rhythm, lungs clear without rales or  wheezes  abdomen: Abdomen soft without tenderness palpation or guarding.  No ascites or spinal megaly. MSK: Normal muscle bulk and tone for age. Neuro: Generalized weakness, symmetric, speech fluent.   No resting tremor. Psych: Sensorium intact and responding to questions, attention normal, affect blunted, judgment insight appear moderately impaired    Data Reviewed: I have personally reviewed following labs and imaging studies:  CBC: Recent Labs  Lab 08/01/19 2134 08/03/19 0509 08/04/19 0712  WBC 8.4 7.7 6.8  NEUTROABS 7.4  --   --   HGB 16.4 15.0 14.5  HCT 51.7 44.8 43.3  MCV 94.3 89.1 88.5  PLT 76* 188 196   Basic Metabolic Panel: Recent Labs  Lab 08/01/19 2134 08/03/19 0509 08/04/19 0712  NA 137 136 134*  K 4.9 3.9 4.7  CL 99 98 99  CO2 25 26 26   GLUCOSE 140* 98 124*  BUN 30* 27* 32*  CREATININE 1.82* 1.26* 1.36*  CALCIUM 10.1 9.1 9.1  MG  --  1.7 1.8   GFR: Estimated Creatinine Clearance: 40.3 mL/min (A) (by C-G formula based on SCr of 1.36 mg/dL (H)). Liver Function Tests: Recent Labs  Lab 08/01/19 2134  AST 35  ALT 17  ALKPHOS 57  BILITOT 1.4*  PROT 8.3*  ALBUMIN 4.5   No results for input(s): LIPASE, AMYLASE in the last 168 hours. No results for input(s): AMMONIA in the last 168 hours. Coagulation Profile: No results for input(s): INR, PROTIME in the last 168 hours. Cardiac Enzymes: No results for input(s): CKTOTAL, CKMB, CKMBINDEX, TROPONINI in the last 168 hours. BNP (last 3 results) No results for input(s): PROBNP in the last 8760 hours. HbA1C: Recent Labs    08/01/19 2134  HGBA1C 6.4*   CBG: Recent Labs  Lab 08/02/19 2355 08/03/19 0349 08/03/19 0755 08/03/19 1158 08/03/19 1553  GLUCAP 92 81 119* 120* 206*   Lipid Profile: Recent Labs    08/02/19 1115  CHOL 207*  HDL 40*  LDLCALC 132*  TRIG 174*  CHOLHDL 5.2   Thyroid Function Tests: Recent Labs    08/01/19 2134  TSH 0.848   Anemia Panel: No results for input(s):  VITAMINB12, FOLATE, FERRITIN, TIBC, IRON, RETICCTPCT in the last 72 hours. Urine analysis:    Component Value Date/Time   COLORURINE YELLOW (A) 08/02/2019 0509   APPEARANCEUR HAZY (A) 08/02/2019 0509   LABSPEC 1.019 08/02/2019 0509   PHURINE 5.0 08/02/2019 0509   GLUCOSEU NEGATIVE 08/02/2019 0509   HGBUR NEGATIVE 08/02/2019 0509   BILIRUBINUR NEGATIVE 08/02/2019 0509   KETONESUR NEGATIVE 08/02/2019 0509   PROTEINUR NEGATIVE 08/02/2019 0509   NITRITE NEGATIVE 08/02/2019 0509   LEUKOCYTESUR NEGATIVE 08/02/2019 0509   Sepsis Labs: @LABRCNTIP (procalcitonin:4,lacticacidven:4)  ) Recent Results (from the past 240 hour(s))  SARS CORONAVIRUS 2 (TAT 6-24 HRS) Nasopharyngeal Nasopharyngeal Swab     Status: None   Collection Time: 08/01/19 11:36 PM   Specimen: Nasopharyngeal Swab  Result Value Ref Range Status   SARS Coronavirus 2 NEGATIVE NEGATIVE Final    Comment: (NOTE) SARS-CoV-2 target nucleic  acids are NOT DETECTED. The SARS-CoV-2 RNA is generally detectable in upper and lower respiratory specimens during the acute phase of infection. Negative results do not preclude SARS-CoV-2 infection, do not rule out co-infections with other pathogens, and should not be used as the sole basis for treatment or other patient management decisions. Negative results must be combined with clinical observations, patient history, and epidemiological information. The expected result is Negative. Fact Sheet for Patients: SugarRoll.be Fact Sheet for Healthcare Providers: https://www.woods-mathews.com/ This test is not yet approved or cleared by the [place] FDA and  has been authorized for detection and/or diagnosis of SARS-CoV-2 by FDA under an Emergency Use Authorization (EUA). This EUA will remain  in effect (meaning this test can be used) for the duration of the COVID-19 declaration under Section 56 4(b)(1) of the Act, 21 U.S.C. section 360bbb-3(b)(1),  unless the authorization is terminated or revoked sooner. Performed at [facility], [place] [address], [place], [place] [address]          Radiology Studies: No results found.      Scheduled Meds: . aspirin  325 mg Oral Daily  . enoxaparin (LOVENOX) injection  40 mg Subcutaneous Q24H  . sodium chloride flush  3 mL Intravenous Q12H   Continuous Infusions:   LOS: 2 days    Time spent: 15 minutes    Edwin Dada, MD [facility] 08/04/2019, 3:11 PM     Please page though [facility] or Epic secure chat:  For [employer], Adult nurse

## 2019-08-04 NOTE — Progress Notes (Signed)
Occupational Therapy Treatment Patient Details Name: Aaron Robles MRN: [id number] DOB: 03-12-1932 Today's Date: 08/04/2019    History of present illness Pt is 86 y/o M wtih PMH: CVA w/o residual deficits, CAD, AKI, DM, and TIA. Pt was found down on his balcony at [facility] after unwitnessed fall. MRI indicates small acute infarct of L basal ganglia.   OT comments  Mr Barich was seen for OT treatment on this date. Upon arrival to room pt was sleeping lightly reclined in bed reporting no pain. Pt agreeable to tx. Pt instructed in importance of supervision and DME for OOB mobility for safety and falls prevention. Of note, pt reports new stiffness in dominant R shoulder limiting ADL tasks - A/PROM shoulder flexion limited ~75* seated EOB. CGA + RW toilet transfer at standard commode c MAX multimodal cues for RW management. MOD A LBD seated EOB. Pt safety awareness and command following limited this session. Pt making good progress toward goals. Pt continues to benefit from skilled OT services to maximize return to PLOF and minimize risk of future falls, injury, caregiver burden, and readmission. Will continue to follow POC. Discharge recommendation remains appropriate.    Follow Up Recommendations  Home health OT    Equipment Recommendations  3 in 1 bedside commode;Tub/shower seat    Recommendations for Other Services      Precautions / Restrictions Precautions Precautions: Fall Restrictions Weight Bearing Restrictions: No       Mobility Bed Mobility Overal bed mobility: Needs Assistance Bed Mobility: Supine to Sit;Sit to Supine     Supine to sit: Min assist;HOB elevated Sit to supine: Min assist;HOB elevated      Transfers Overall transfer level: Needs assistance Equipment used: Rolling walker (2 wheeled) Transfers: Sit to/from Stand Sit to Stand: Min guard         General transfer comment: minA for eccentric control to sit. MAX A for RW management during in room  functional mobility     Balance Overall balance assessment: Needs assistance Sitting-balance support: Feet supported Sitting balance-Leahy Scale: Fair     Standing balance support: Bilateral upper extremity supported Standing balance-Leahy Scale: Poor                             ADL either performed or assessed with clinical judgement   ADL Overall ADL's : Needs assistance/impaired                                       General ADL Comments: Pt reported increased difficulty using dominant RUE for ADLs (self-feeding) 2/2 impaired shoulder A/PROM. CGA + RW toilet transfer at standard commode - pt stated he needed to use toilet but after sitting for several minutes stood to return to bed. MOD A LBD seated EOB.      Vision       Perception     Praxis      Cognition Arousal/Alertness: Awake/alert Behavior During Therapy: WFL for tasks assessed/performed Overall Cognitive Status: No family/caregiver present to determine baseline cognitive functioning                                 General Comments: Demonstrated difficulty following 1 step commands and poor safety awareness / carryover of training         Exercises  Exercises: Other exercises Other Exercises Other Exercises: Pt educated re: importance of supervision and DME for safety, safe RW technique Other Exercises: Bed mobility, sit<>sup, sit<>stand x2, toileting at standard commode, sitting/standing balance/tolerance    Shoulder Instructions       General Comments      Pertinent Vitals/ Pain       Pain Assessment: No/denies pain  Home Living                                          Prior Functioning/Environment              Frequency  Min 2X/week        Progress Toward Goals  OT Goals(current goals can now be found in the care plan section)  Progress towards OT goals: Progressing toward goals  Acute Rehab OT Goals Patient Stated Goal:  to get stronger OT Goal Formulation: With patient Time For Goal Achievement: 08/16/19 Potential to Achieve Goals: Good ADL Goals Pt Will Perform Lower Body Dressing: with supervision;sit to/from stand Pt Will Transfer to Toilet: with supervision;ambulating;grab bars Pt Will Perform Toileting - Clothing Manipulation and hygiene: with supervision;sit to/from stand  Plan Discharge plan remains appropriate;Frequency remains appropriate    Co-evaluation                 AM-PAC OT "6 Clicks" Daily Activity     Outcome Measure   Help from another person eating meals?: A Little Help from another person taking care of personal grooming?: A Little Help from another person toileting, which includes using toliet, bedpan, or urinal?: A Little Help from another person bathing (including washing, rinsing, drying)?: A Little Help from another person to put on and taking off regular upper body clothing?: A Little Help from another person to put on and taking off regular lower body clothing?: A Little 6 Click Score: 18    End of Session Equipment Utilized During Treatment: Rolling walker  OT Visit Diagnosis: Unsteadiness on feet (R26.81);Muscle weakness (generalized) (M62.81)   Activity Tolerance Patient tolerated treatment well   Patient Left in bed;with call bell/phone within reach;with bed alarm set   Nurse Communication          Time: 8099-8338 OT Time Calculation (min): 24 min  Charges: OT General Charges $OT Visit: 1 Visit OT Treatments $Self Care/Home Management : 23-37 mins  Dessie Coma, M.S. OTR/L  08/04/19, 1:56 PM

## 2019-08-05 LAB — BASIC METABOLIC PANEL
Anion gap: 9 (ref 5–15)
BUN: 31 mg/dL — ABNORMAL HIGH (ref 8–23)
CO2: 24 mmol/L (ref 22–32)
Calcium: 8.9 mg/dL (ref 8.9–10.3)
Chloride: 102 mmol/L (ref 98–111)
Creatinine, Ser: 1.27 mg/dL — ABNORMAL HIGH (ref 0.61–1.24)
GFR calc Af Amer: 59 mL/min — ABNORMAL LOW (ref >60)
GFR calc non Af Amer: 51 mL/min — ABNORMAL LOW (ref >60)
Glucose, Bld: 121 mg/dL — ABNORMAL HIGH (ref 70–99)
Potassium: 3.6 mmol/L (ref 3.5–5.1)
Sodium: 135 mmol/L (ref 135–145)

## 2019-08-05 LAB — GLUCOSE, CAPILLARY
Glucose-Capillary: 120 mg/dL — ABNORMAL HIGH (ref 70–99)
Glucose-Capillary: 147 mg/dL — ABNORMAL HIGH (ref 70–99)
Glucose-Capillary: 121 mg/dL — ABNORMAL HIGH (ref 70–99)

## 2019-08-05 LAB — CBC
HCT: 42.4 % (ref 39.0–52.0)
Hemoglobin: 14.3 g/dL (ref 13.0–17.0)
MCH: 29.7 pg (ref 26.0–34.0)
MCHC: 33.7 g/dL (ref 30.0–36.0)
MCV: 88 fL (ref 80.0–100.0)
Platelets: 207 10*3/uL (ref 150–400)
RBC: 4.82 MIL/uL (ref 4.22–5.81)
RDW: 13.3 % (ref 11.5–15.5)
WBC: 6.5 10*3/uL (ref 4.0–10.5)
nRBC: 0 % (ref 0.0–0.2)

## 2019-08-05 LAB — MAGNESIUM: Magnesium: 1.8 mg/dL (ref 1.7–2.4)

## 2019-08-05 MED ORDER — METFORMIN HCL 500 MG PO TABS
500.0000 mg | ORAL_TABLET | Freq: Every day | ORAL | Status: DC
  Administered 2019-08-06 – 2019-08-07 (×2): 500 mg via ORAL
  Filled 2019-08-05 (×2): qty 1

## 2019-08-05 MED ORDER — CHLORHEXIDINE GLUCONATE 0.12 % MT SOLN
15.0000 mL | Freq: Two times a day (BID) | OROMUCOSAL | Status: DC
  Administered 2019-08-05 – 2019-08-06 (×3): 15 mL via OROMUCOSAL
  Filled 2019-08-05 (×3): qty 15

## 2019-08-05 NOTE — Progress Notes (Signed)
PROGRESS NOTE    Aaron Robles  VKP:[id number] DOB: 08-Apr-1932 DOA: 08/01/2019 PCP: System, Pcp Not In      Brief Narrative:  Aaron Robles is a 86 y.o. M with TIA, CAD, DM, and progressive 1 year neurological decline, with shuffling gait, increased weakness, and tremor who presented with unwitnessed fall.  In the ER MRI showed subcentimeter basal ganglia stroke.  Patient was admitted for stroke work-up.       Assessment & Plan:  Left basal ganglia stroke MRI brain on admission showed 76mm L basal ganglia acute infarct. -Echocardiogram showed no cardiogenic source of embolism -Carotid imaging unremarkable   -Patient has history rhabdo with statins, contraindicated -Aspirin ordered at admission --> continue aspirin 325 -Follow up with Neurology -Atrial fibrillation: not present on tele -tPA not given because outside window -Dysphagia screen ordered in ER -PT eval ordered: recommended SNF -Smoking cessation: not applicable    Unwitnessed fall Progressive weakness, memory loss Family report that the patient has had progressive shuffling gait, withdrawn personality, decreased function and maybe tremor.  He is being evaluated for parkinsonism, frequent falls, and memory loss over the last 2 years. -Follow up with Neurology  Acute kidney injury Creatinine 1.8 on admission, improved to 1.3 with fluids.  Coronary artery disease Hypertension Blood pressure normal here off meds -Hold lisinopril -Continue aspirin  Reported diabetes Patient on glipizide and Metformin at home.  However had severe hypoglycemia here. A1c 6.4%, adequate.  -Resume Metformin and hold glipizide  -Continue periodic Accu-Cheks            Disposition: Status is: Inpatient  Remains inpatient appropriate because:Unsafe d/c plan   Dispo: The patient is from: Home              Anticipated d/c is to: SNF              Anticipated d/c date is: 1 day              Patient currently is medically  stable to d/c.            MDM: The below labs and imaging reports reviewed and summarized above.  Medication management as above.    DVT prophylaxis: Lovenox Code Status: Full code Family Communication:      Consultants:   Neurology  Procedures:   4/7 carotid ultrasound  4/7 echocardiogram  4/7 MRI brain  Antimicrobials:      Culture data:              Subjective: Patient sleeping, easily arousable, no new fever, no complaints.     Objective: Vitals:   08/04/19 1832 08/04/19 2331 08/05/19 0500 08/05/19 0835  BP: 110/70 126/69  118/73  Pulse: 80 80  72  Resp: 16 15  14   Temp: 97.7 F (36.5 C) 98.3 F (36.8 C)  98.6 F (37 C)  TempSrc: Oral     SpO2: 98% 100%  97%  Weight:   78.6 kg   Height:        Intake/Output Summary (Last 24 hours) at 08/05/2019 1326 Last data filed at 08/05/2019 1015 Gross per 24 hour  Intake 600 ml  Output --  Net 600 ml   Filed Weights   08/03/19 0757 08/04/19 0505 08/05/19 0500  Weight: 80.2 kg 81.8 kg 78.6 kg    Examination: General appearance: Elderly adult male, lying in bed, sleeping, easily arousable, no obvious distress.    Data Reviewed: I have personally reviewed following labs and imaging studies:  CBC:  Recent Labs  Lab 08/01/19 2134 08/03/19 0509 08/04/19 0712 08/05/19 0644  WBC 8.4 7.7 6.8 6.5  NEUTROABS 7.4  --   --   --   HGB 16.4 15.0 14.5 14.3  HCT 51.7 44.8 43.3 42.4  MCV 94.3 89.1 88.5 88.0  PLT 76* 188 196 207   Basic Metabolic Panel: Recent Labs  Lab 08/01/19 2134 08/03/19 0509 08/04/19 0712 08/05/19 0644  NA 137 136 134* 135  K 4.9 3.9 4.7 3.6  CL 99 98 99 102  CO2 25 26 26 24   GLUCOSE 140* 98 124* 121*  BUN 30* 27* 32* 31*  CREATININE 1.82* 1.26* 1.36* 1.27*  CALCIUM 10.1 9.1 9.1 8.9  MG  --  1.7 1.8 1.8   GFR: Estimated Creatinine Clearance: 39.7 mL/min (A) (by C-G formula based on SCr of 1.27 mg/dL (H)). Liver Function Tests: Recent Labs  Lab  08/01/19 2134  AST 35  ALT 17  ALKPHOS 57  BILITOT 1.4*  PROT 8.3*  ALBUMIN 4.5   No results for input(s): LIPASE, AMYLASE in the last 168 hours. No results for input(s): AMMONIA in the last 168 hours. Coagulation Profile: No results for input(s): INR, PROTIME in the last 168 hours. Cardiac Enzymes: No results for input(s): CKTOTAL, CKMB, CKMBINDEX, TROPONINI in the last 168 hours. BNP (last 3 results) No results for input(s): PROBNP in the last 8760 hours. HbA1C: No results for input(s): HGBA1C in the last 72 hours. CBG: Recent Labs  Lab 08/03/19 0755 08/03/19 1158 08/03/19 1553 08/05/19 0833 08/05/19 1227  GLUCAP 119* 120* 206* 120* 147*   Lipid Profile: No results for input(s): CHOL, HDL, LDLCALC, TRIG, CHOLHDL, LDLDIRECT in the last 72 hours. Thyroid Function Tests: No results for input(s): TSH, T4TOTAL, FREET4, T3FREE, THYROIDAB in the last 72 hours. Anemia Panel: No results for input(s): VITAMINB12, FOLATE, FERRITIN, TIBC, IRON, RETICCTPCT in the last 72 hours. Urine analysis:    Component Value Date/Time   COLORURINE YELLOW (A) 08/02/2019 0509   APPEARANCEUR HAZY (A) 08/02/2019 0509   LABSPEC 1.019 08/02/2019 0509   PHURINE 5.0 08/02/2019 0509   GLUCOSEU NEGATIVE 08/02/2019 0509   HGBUR NEGATIVE 08/02/2019 0509   BILIRUBINUR NEGATIVE 08/02/2019 0509   KETONESUR NEGATIVE 08/02/2019 0509   PROTEINUR NEGATIVE 08/02/2019 0509   NITRITE NEGATIVE 08/02/2019 0509   LEUKOCYTESUR NEGATIVE 08/02/2019 0509   Sepsis Labs: @LABRCNTIP (procalcitonin:4,lacticacidven:4)  ) Recent Results (from the past 240 hour(s))  SARS CORONAVIRUS 2 (TAT 6-24 HRS) Nasopharyngeal Nasopharyngeal Swab     Status: None   Collection Time: 08/01/19 11:36 PM   Specimen: Nasopharyngeal Swab  Result Value Ref Range Status   SARS Coronavirus 2 NEGATIVE NEGATIVE Final    Comment: (NOTE) SARS-CoV-2 target nucleic acids are NOT DETECTED. The SARS-CoV-2 RNA is generally detectable in upper and  lower respiratory specimens during the acute phase of infection. Negative results do not preclude SARS-CoV-2 infection, do not rule out co-infections with other pathogens, and should not be used as the sole basis for treatment or other patient management decisions. Negative results must be combined with clinical observations, patient history, and epidemiological information. The expected result is Negative. Fact Sheet for Patients: Fact Sheet for Healthcare Providers: 10/01/19 This test is not yet approved or cleared by the [place] FDA and  has been authorized for detection and/or diagnosis of SARS-CoV-2 by FDA under an Emergency Use Authorization (EUA). This EUA will remain  in effect (meaning this test can be used) for the duration of the COVID-19 declaration  under Section 56 4(b)(1) of the Act, 21 U.S.C. section 360bbb-3(b)(1), unless the authorization is terminated or revoked sooner. Performed at [facility], [place] [address], [place], [place] [address]          Radiology Studies: No results found.      Scheduled Meds: . aspirin  325 mg Oral Daily  . enoxaparin (LOVENOX) injection  40 mg Subcutaneous Q24H  . sodium chloride flush  3 mL Intravenous Q12H   Continuous Infusions:   LOS: 3 days    Time spent: 15 minutes    Edwin Dada, MD [facility] 08/05/2019, 1:26 PM     Please page though [place] or Epic secure chat:  For [employer], Adult nurse

## 2019-08-05 NOTE — Progress Notes (Signed)
Physical Therapy Treatment Patient Details Name: Aaron Robles MRN: [id number] DOB: December 22, 1931 Today's Date: 08/05/2019    History of Present Illness Pt is 86 y/o M wtih PMH: CVA w/o residual deficits, CAD, AKI, DM, and TIA. Pt was found down on his balcony at [facility] after unwitnessed fall. MRI indicates small acute infarct of L basal ganglia.    PT Comments    Pt was long sitting in bed upon arriving. He agrees to PT session and is cooperative throughout. Pt is alert however demonstrates cognition deficits. Disoriented to day/ time and has poor insight of deficits. Pt is impulsive with all OOB activity. He required CGA for all mobility however has several episodes of min assist + max vcs for safety/cueing for slowing down. While ambulating in hallway, pt has two episodes of incontinence of urine without warning. He was seated in chair and cleaned prior to returning to room. Overall pt tolerated session well but has poor insight of deficits. Therapist recommends DC to SNF to address gait, strength, and safe functional mobility. Acute PT will continue to follow per POC progressing as able.     Follow Up Recommendations  SNF     Equipment Recommendations  Rolling walker with 5" wheels    Recommendations for Other Services       Precautions / Restrictions Precautions Precautions: Fall Restrictions Weight Bearing Restrictions: No    Mobility  Bed Mobility Overal bed mobility: Needs Assistance Bed Mobility: Supine to Sit     Supine to sit: Supervision;HOB elevated Sit to supine: Supervision   General bed mobility comments: pt requires no assistance with exiting and re-entering bed however did need cueing for safety and technique.  Transfers Overall transfer level: Needs assistance Equipment used: Rolling walker (2 wheeled) Transfers: Sit to/from Stand Sit to Stand: Min guard         General transfer comment: CGA for safety with all transfers. pt performed STS 2 x  EOB and 2 x from standard height chair in hallway after two episodes of incontinence of urine.  Ambulation/Gait Ambulation/Gait assistance: Min guard Gait Distance (Feet): 100 Feet Assistive device: Rolling walker (2 wheeled) Gait Pattern/deviations: Shuffle;Trunk flexed;Narrow base of support;Decreased step length - right;Decreased step length - left;Decreased stance time - right;Decreased stance time - left Gait velocity: impulsive with varied gait speed throughout   General Gait Details: pt requires max vcs throughout gait training for improved gait kinematics. pt tends to ambulate with shuffling fwd lean and poor step length/foot clearance. He has two episodes of urinating in hallway during ambulation. Overall pt tolerated gait training well but poor carryover between gait trials   Stairs             Wheelchair Mobility    Modified Rankin (Stroke Patients Only)       Balance Overall balance assessment: Needs assistance Sitting-balance support: Feet supported Sitting balance-Leahy Scale: Good Sitting balance - Comments: no LOB or unsteadiness in sitting EOB    Standing balance support: Bilateral upper extremity supported Standing balance-Leahy Scale: Poor Standing balance comment: pt is very unsteady on feet even with UE support on RW. impulsivity and poor safety awareness throughout session                            Cognition Arousal/Alertness: Awake/alert Behavior During Therapy: WFL for tasks assessed/performed Overall Cognitive Status: No family/caregiver present to determine baseline cognitive functioning  General Comments: pt is impulsive and per RN has been attempting to get OOB to BR without assistance. Pt is alert throughout but cognition deficits are present.       Exercises      General Comments        Pertinent Vitals/Pain Pain Assessment: No/denies pain    Home Living                       Prior Function            PT Goals (current goals can now be found in the care plan section) Acute Rehab PT Goals Patient Stated Goal: " I want to walk better" Progress towards PT goals: Progressing toward goals    Frequency    7X/week      PT Plan Current plan remains appropriate    Co-evaluation              AM-PAC PT "6 Clicks" Mobility   Outcome Measure  Help needed turning from your back to your side while in a flat bed without using bedrails?: None Help needed moving from lying on your back to sitting on the side of a flat bed without using bedrails?: A Little Help needed moving to and from a bed to a chair (including a wheelchair)?: A Little Help needed standing up from a chair using your arms (e.g., wheelchair or bedside chair)?: A Little Help needed to walk in hospital room?: A Little Help needed climbing 3-5 steps with a railing? : Total 6 Click Score: 17    End of Session Equipment Utilized During Treatment: Gait belt Activity Tolerance: Patient tolerated treatment well Patient left: in bed;with call bell/phone within reach;with bed alarm set Nurse Communication: Mobility status PT Visit Diagnosis: Other abnormalities of gait and mobility (R26.89);Muscle weakness (generalized) (M62.81);Difficulty in walking, not elsewhere classified (R26.2)     Time: 3846-6599 PT Time Calculation (min) (ACUTE ONLY): 19 min  Charges:  $Gait Training: 8-22 mins                     Jetta Lout PTA 08/05/19, 5:30 PM

## 2019-08-06 DIAGNOSIS — I2583 Coronary atherosclerosis due to lipid rich plaque: Secondary | ICD-10-CM

## 2019-08-06 DIAGNOSIS — I2510 Atherosclerotic heart disease of native coronary artery without angina pectoris: Secondary | ICD-10-CM

## 2019-08-06 LAB — GLUCOSE, CAPILLARY: Glucose-Capillary: 121 mg/dL — ABNORMAL HIGH (ref 70–99)

## 2019-08-06 MED ORDER — MELATONIN 5 MG PO TABS
5.0000 mg | ORAL_TABLET | Freq: Every evening | ORAL | Status: DC | PRN
  Administered 2019-08-06: 23:00:00 5 mg via ORAL
  Filled 2019-08-06 (×3): qty 1

## 2019-08-06 MED ORDER — DIPHENHYDRAMINE HCL 12.5 MG/5ML PO ELIX
6.2500 mg | ORAL_SOLUTION | Freq: Every evening | ORAL | Status: DC | PRN
  Filled 2019-08-06: qty 5

## 2019-08-06 NOTE — Progress Notes (Signed)
Physical Therapy Treatment Patient Details Name: Aaron Robles MRN: [id number] DOB: February 04, 1932 Today's Date: 08/06/2019    History of Present Illness Pt is 86 y/o M wtih PMH: CVA w/o residual deficits, CAD, AKI, DM, and TIA. Pt was found down on his balcony at [facility] after unwitnessed fall. MRI indicates small acute infarct of L basal ganglia.    PT Comments    Pt sitting EOB upon arrival.  Steady in sitting.  Brief donned due to inc of urine yesterday while walking in way.  Stood to [employer] with min guard and poor hand placements pulling up on walker despite cues.  He is able to progress gait 120' in hallway and demonstrates less impulsivity and improved balance but he has a very short shuffling gait patterns and at times gets "frozen" and unable to step with walker getting further ahead of him needing assist to pull it back into a safe position.  He presents today with Parkinsons-like gait pattern.  He voices he also feels like his feet are "stuck" at times.  RN noted pt's gait pattern is irregular and at times he will walk well and at times presents like he did today in session.  He denies fatigue with activity.  Remained in recliner after session for breakfast.   Follow Up Recommendations  SNF     Equipment Recommendations  Rolling walker with 5" wheels    Recommendations for Other Services       Precautions / Restrictions Precautions Precautions: Fall Restrictions Weight Bearing Restrictions: No    Mobility  Bed Mobility               General bed mobility comments: sitting EOB on his own upon arrival  Transfers Overall transfer level: Needs assistance Equipment used: Rolling walker (2 wheeled) Transfers: Sit to/from Stand Sit to Stand: Min guard         General transfer comment: pulls on walker to stand despite cues  Ambulation/Gait Ambulation/Gait assistance: Min assist Gait Distance (Feet): 120 Feet Assistive device: Rolling walker (2 wheeled) Gait  Pattern/deviations: Decreased step length - right;Decreased step length - left;Shuffle;Trunk flexed     General Gait Details: very short shuffling Parkinsons-like gait pattern  poor walker position needing assist to pull it back in to him at times.   Stairs             Wheelchair Mobility    Modified Rankin (Stroke Patients Only)       Balance Overall balance assessment: Needs assistance Sitting-balance support: Feet supported Sitting balance-Leahy Scale: Good     Standing balance support: Bilateral upper extremity supported Standing balance-Leahy Scale: Poor Standing balance comment: steady in static standing and generally with gait but shuffling gait afffects balance and safety                            Cognition Arousal/Alertness: Awake/alert Behavior During Therapy: WFL for tasks assessed/performed Overall Cognitive Status: No family/caregiver present to determine baseline cognitive functioning                                 General Comments: less impulsive this session      Exercises      General Comments        Pertinent Vitals/Pain Pain Assessment: No/denies pain    Home Living  Prior Function            PT Goals (current goals can now be found in the care plan section) Progress towards PT goals: Progressing toward goals    Frequency    7X/week      PT Plan Current plan remains appropriate    Co-evaluation              AM-PAC PT "6 Clicks" Mobility   Outcome Measure  Help needed turning from your back to your side while in a flat bed without using bedrails?: None Help needed moving from lying on your back to sitting on the side of a flat bed without using bedrails?: A Little Help needed moving to and from a bed to a chair (including a wheelchair)?: A Little Help needed standing up from a chair using your arms (e.g., wheelchair or bedside chair)?: A Little Help needed to  walk in hospital room?: A Little Help needed climbing 3-5 steps with a railing? : Total 6 Click Score: 17    End of Session Equipment Utilized During Treatment: Gait belt Activity Tolerance: Patient tolerated treatment well Patient left: in chair;with call bell/phone within reach;with chair alarm set Nurse Communication: Mobility status       Time: 1572-6203 PT Time Calculation (min) (ACUTE ONLY): 13 min  Charges:  $Gait Training: 8-22 mins                    Chesley Noon, PTA 08/06/19, 10:24 AM

## 2019-08-06 NOTE — Progress Notes (Signed)
PROGRESS NOTE    Aaron Robles  OIZ:[id number] DOB: Aug 08, 1931 DOA: 08/01/2019 PCP: System, Pcp Not In      Brief Narrative:  [name] is a 86 y.o. M with TIA, CAD, DM, and progressive 1 year neurological decline, with shuffling gait, increased weakness, and tremor who presented with unwitnessed fall.  In the ER MRI showed subcentimeter basal ganglia stroke.  Patient was admitted for stroke work-up.         Assessment & Plan:  Left basal ganglia stroke MRI brain on admission showed 32mm L basal ganglia acute infarct. -Echocardiogram showed no cardiogenic source of embolism -Carotid imaging unremarkable   -Patient has history rhabdo with statins, contraindicated -Aspirin ordered at admission --> continue aspirin 325 -Follow up with Neurology -Atrial fibrillation: not present on tele -tPA not given because outside window -Dysphagia screen ordered in ER -PT eval ordered: recommended SNF -Smoking cessation: not applicable    Unwitnessed fall Progressive weakness, memory loss Family report that the patient has had progressive shuffling gait, withdrawn personality, decreased function and maybe tremor.  He is being evaluated for parkinsonism, frequent falls, and memory loss over the last 2 years. -Follow up with Neurology  Acute kidney injury Creatinine 1.8 on admission, improved to 1.3 with fluids.  Coronary artery disease Hypertension Blood pressure normal here off meds -Hold lisinopril -Continue aspirin  Reported diabetes Patient on glipizide and Metformin at home.  However had severe hypoglycemia here. A1c 6.4%, adequate.  -Continue Metformin  -Stop glipizide -Continue periodic Accu-Cheks            Disposition: Status is: Inpatient  Remains inpatient appropriate because:Unsafe d/c plan   Dispo: The patient is from: Home              Anticipated d/c is to: SNF              Anticipated d/c date is: 1 day              Patient currently is medically  stable to d/c.            MDM: The below labs and imaging reports reviewed and summarized above.  Medication management as above.    DVT prophylaxis: Lovenox Code Status: Full code Family Communication:  Daughter yesterday afternoon by phone    Consultants:   Neurology  Procedures:   4/7 carotid ultrasound  4/7 echocardiogram  4/7 MRI brain  Antimicrobials:      Culture data:              Subjective: Patient feeling well.  No chest pain, headache, abdominal pain, vomiting, respiratory distress.    Objective: Vitals:   08/05/19 0500 08/05/19 0835 08/06/19 0024 08/06/19 0752  BP:  118/73 119/81 105/72  Pulse:  72 66 65  Resp:  14 18 18   Temp:  98.6 F (37 C)  98.3 F (36.8 C)  TempSrc:      SpO2:  97% 100% 99%  Weight: 78.6 kg     Height:        Intake/Output Summary (Last 24 hours) at 08/06/2019 1334 Last data filed at 08/06/2019 1012 Gross per 24 hour  Intake 600 ml  Output --  Net 600 ml   Filed Weights   08/03/19 0757 08/04/19 0505 08/05/19 0500  Weight: 80.2 kg 81.8 kg 78.6 kg    Examination: General appearance: Elderly adult male, alert and in no acute distress.  Watching television HEENT: Anicteric, conjunctiva pink, lids and lashes normal. No nasal deformity,  discharge, epistaxis.  Lips moist.  Oropharynx moist, no oral lesions absent, hearing normal.  Skin: Warm and dry.  No suspicious rashes or lesions. Cardiac: RRR, no murmurs appreciated.  JVP normal, no lower extremity edema   Respiratory: Normal respiratory rate and rhythm.  CTAB without rales or wheezes. Abdomen: Abdomen soft.  No tenderness palpation or guarding. No ascites, distension, hepatosplenomegaly.   MSK: No deformities or effusions of the large joints of the upper or lower extremities bilaterally. Neuro: Awake and alert. Naming is grossly intact, and the patient's recall, recent and remote, as well as general fund of knowledge seem impaired.  Muscle tone  normal, without fasciculations.  Moves all extremities equally and with normal coordination but with generalized weakness.  Speech fluent.    Psych: Sensorium intact and responding to questions, attention normal. Affect pleasant.  Judgment and insight appear moderately impaired by dementia.      Data Reviewed: I have personally reviewed following labs and imaging studies:  CBC: Recent Labs  Lab 08/01/19 2134 08/03/19 0509 08/04/19 0712 08/05/19 0644  WBC 8.4 7.7 6.8 6.5  NEUTROABS 7.4  --   --   --   HGB 16.4 15.0 14.5 14.3  HCT 51.7 44.8 43.3 42.4  MCV 94.3 89.1 88.5 88.0  PLT 76* 188 196 207   Basic Metabolic Panel: Recent Labs  Lab 08/01/19 2134 08/03/19 0509 08/04/19 0712 08/05/19 0644  NA 137 136 134* 135  K 4.9 3.9 4.7 3.6  CL 99 98 99 102  CO2 25 26 26 24   GLUCOSE 140* 98 124* 121*  BUN 30* 27* 32* 31*  CREATININE 1.82* 1.26* 1.36* 1.27*  CALCIUM 10.1 9.1 9.1 8.9  MG  --  1.7 1.8 1.8   GFR: Estimated Creatinine Clearance: 39.7 mL/min (A) (by C-G formula based on SCr of 1.27 mg/dL (H)). Liver Function Tests: Recent Labs  Lab 08/01/19 2134  AST 35  ALT 17  ALKPHOS 57  BILITOT 1.4*  PROT 8.3*  ALBUMIN 4.5   No results for input(s): LIPASE, AMYLASE in the last 168 hours. No results for input(s): AMMONIA in the last 168 hours. Coagulation Profile: No results for input(s): INR, PROTIME in the last 168 hours. Cardiac Enzymes: No results for input(s): CKTOTAL, CKMB, CKMBINDEX, TROPONINI in the last 168 hours. BNP (last 3 results) No results for input(s): PROBNP in the last 8760 hours. HbA1C: No results for input(s): HGBA1C in the last 72 hours. CBG: Recent Labs  Lab 08/03/19 1158 08/03/19 1553 08/05/19 0833 08/05/19 1227 08/05/19 1726  GLUCAP 120* 206* 120* 147* 121*   Lipid Profile: No results for input(s): CHOL, HDL, LDLCALC, TRIG, CHOLHDL, LDLDIRECT in the last 72 hours. Thyroid Function Tests: No results for input(s): TSH, T4TOTAL, FREET4,  T3FREE, THYROIDAB in the last 72 hours. Anemia Panel: No results for input(s): VITAMINB12, FOLATE, FERRITIN, TIBC, IRON, RETICCTPCT in the last 72 hours. Urine analysis:    Component Value Date/Time   COLORURINE YELLOW (A) 08/02/2019 0509   APPEARANCEUR HAZY (A) 08/02/2019 0509   LABSPEC 1.019 08/02/2019 0509   PHURINE 5.0 08/02/2019 0509   GLUCOSEU NEGATIVE 08/02/2019 0509   HGBUR NEGATIVE 08/02/2019 0509   BILIRUBINUR NEGATIVE 08/02/2019 0509   KETONESUR NEGATIVE 08/02/2019 0509   PROTEINUR NEGATIVE 08/02/2019 0509   NITRITE NEGATIVE 08/02/2019 0509   LEUKOCYTESUR NEGATIVE 08/02/2019 0509   Sepsis Labs: @LABRCNTIP (procalcitonin:4,lacticacidven:4)  ) Recent Results (from the past 240 hour(s))  SARS CORONAVIRUS 2 (TAT 6-24 HRS) Nasopharyngeal Nasopharyngeal Swab     Status:  None   Collection Time: 08/01/19 11:36 PM   Specimen: Nasopharyngeal Swab  Result Value Ref Range Status   SARS Coronavirus 2 NEGATIVE NEGATIVE Final    Comment: (NOTE) SARS-CoV-2 target nucleic acids are NOT DETECTED. The SARS-CoV-2 RNA is generally detectable in upper and lower respiratory specimens during the acute phase of infection. Negative results do not preclude SARS-CoV-2 infection, do not rule out co-infections with other pathogens, and should not be used as the sole basis for treatment or other patient management decisions. Negative results must be combined with clinical observations, patient history, and epidemiological information. The expected result is Negative. Fact Sheet for Patients: SugarRoll.be Fact Sheet for Healthcare Providers: https://www.woods-mathews.com/ This test is not yet approved or cleared by the [place] FDA and  has been authorized for detection and/or diagnosis of SARS-CoV-2 by FDA under an Emergency Use Authorization (EUA). This EUA will remain  in effect (meaning this test can be used) for the duration of the COVID-19  declaration under Section 56 4(b)(1) of the Act, 21 U.S.C. section 360bbb-3(b)(1), unless the authorization is terminated or revoked sooner. Performed at [facility], [place] [address], [place], [place] [address]          Radiology Studies: No results found.      Scheduled Meds: . aspirin  325 mg Oral Daily  . chlorhexidine  15 mL Mouth/Throat BID  . enoxaparin (LOVENOX) injection  40 mg Subcutaneous Q24H  . metFORMIN  500 mg Oral Q breakfast  . sodium chloride flush  3 mL Intravenous Q12H   Continuous Infusions:   LOS: 4 days    Time spent: 25 minutes    Edwin Dada, MD [facility] 08/06/2019, 1:34 PM     Please page though [place] or Epic secure chat:  For [employer], Adult nurse

## 2019-08-06 NOTE — TOC Progression Note (Signed)
Transition of Care [facility]) - Progression Note    Patient Details  Name: Aaron Robles MRN: [id number] Date of Birth: [birth year]  [facility]) CM/SW Contact  Maud Deed, [place] Phone Number: 08/06/2019, 2:06 PM  Clinical Narrative:    CSW spoke with pt's daughter and she notified that she would like the pt to go with [facility] in [place] and she had already spoken to the admissions director. CSW contacted [facility] to get a fax number for referral to be sent over. CSW spoke with Geneticist, molecular) and she provided an email address for the referral to be scanned to for review. CSW scanned the referral documents to [place] at [email or web address].  TOC will follow up on referral tomorrow 4/12        Expected Discharge Plan and Services                                                 Social Determinants of Health (SDOH) Interventions    Readmission Risk Interventions No flowsheet data found.

## 2019-08-07 LAB — RESPIRATORY PANEL BY RT PCR (FLU A&B, COVID)
Influenza A by PCR: NEGATIVE
Influenza B by PCR: NEGATIVE
SARS Coronavirus 2 by RT PCR: NEGATIVE

## 2019-08-07 MED ORDER — MELATONIN 5 MG PO TABS: 5.0000 mg | ORAL_TABLET | Freq: Every evening | ORAL | 0 refills | Status: AC | PRN

## 2019-08-07 MED ORDER — ASPIRIN 325 MG PO TABS: 325.0000 mg | ORAL_TABLET | Freq: Every day | ORAL | Status: AC

## 2019-08-07 NOTE — TOC Progression Note (Signed)
Transition of Care [facility]) - Progression Note    Patient Details  Name: Aaron Robles MRN: [id number] Date of Birth: 11-02-1931  [facility]) CM/SW Contact  Cree Kunert, Lemar Livings, LCSW Phone Number: 08/07/2019, 11:06 AM  Clinical Narrative:  Daughter has accepted bed at [facility]. Had offers from [place]. Awaiting rapid COVID test results. Plan to get transferred to facility today. Will need negative COVID test prior to DC           Expected Discharge Plan and Services           Expected Discharge Date: 08/07/19                                     Social Determinants of Health (SDOH) Interventions    Readmission Risk Interventions No flowsheet data found.

## 2019-08-07 NOTE — TOC Transition Note (Signed)
Transition of Care [facility]) - CM/SW Discharge Note   Patient Details  Name: Aaron Robles MRN: [id number] Date of Birth: 07/29/1931  Transition [facility]) CM/SW Contact:  Lucy Chris, LCSW Phone Number: 08/07/2019, 11:56 AM   Clinical Narrative:   Daughter has accepted bed at [facility]. Pt awaiting rapid COVID test-Betty-bedside RN to do this. MD has completed paperwork and will get on EMS list once COVID test is back. Bedside RN to call report to [phone].     Final next level of care: Skilled Nursing Facility Barriers to Discharge: Barriers Resolved   Patient Goals and CMS Choice   CMS Medicare.gov Compare Post Acute Care list provided to:: Patient Represenative (must comment)(daughter-POA)    Discharge Placement PASRR number recieved: 08/01/19            Patient chooses bed at: [facility] Patient to be transferred to facility by: EMS Name of family member notified: Dr Hall-daughter Patient and family notified of of transfer: 08/07/19  Discharge Plan and Services                                     Social Determinants of Health (SDOH) Interventions     Readmission Risk Interventions No flowsheet data found.

## 2019-08-07 NOTE — TOC Progression Note (Signed)
Transition of Care [facility]) - Progression Note    Patient Details  Name: Aaron Robles MRN: [id number] Date of Birth: 11/23/1931  Transition of Care [facility]) CM/SW Contact  Rylin Seavey, Lemar Livings, LCSW Phone Number: 08/07/2019, 9:40 AM  Clinical Narrative:   Have contacted [facility] to find out if can offer bed. Also spoke with Daughter who is working on bed offer also. Daughter aware pt is medically stable to transfer today and needs too. Will need rapid COVID test. Spoke with [facility] and they could take him today. Will push daughter to make a decision.         Expected Discharge Plan and Services                                                 Social Determinants of Health (SDOH) Interventions    Readmission Risk Interventions No flowsheet data found.

## 2019-08-07 NOTE — Discharge Summary (Signed)
Physician Discharge Summary  Wael Maestas ZOX:[id number] DOB: 05-28-1931 DOA: 08/01/2019  PCP: System, Pcp Not In  Admit date: 08/01/2019 Discharge date: 08/07/2019  Admitted From: Independent Living at [facility]  Disposition:  SNF in [facility]   Recommendations for Outpatient Follow-up:  1. Follow arrange Neurology follow up in 1 month 2. Please arrange PCP follow up 1 week after discharge   Home Health: N/A  Equipment/Devices: TBD at SNF  Discharge Condition: Fair  CODE STATUS: FULL Diet recommendation: Cardiac, diabetic  Brief/Interim Summary: Mr. Leeman is a 86 y.o. M with TIA, CAD, DM, and progressive 1 year neurological decline, with shuffling gait, increased weakness, and tremor who presented with unwitnessed fall.  On the day of admission, patient had had a dental procedure in the morning. Had received pain medication post-procedure daughter believes, and later in the day, he was found down.     In the ER MRI showed subcentimeter basal ganglia stroke.  Patient was admitted for stroke work-up.       PRINCIPAL HOSPITAL DIAGNOSIS: Acute LEFT basal ganglia stroke    Discharge Diagnoses:   Left basal ganglia stroke MRI brain on admission showed 5mm L basal ganglia acute infarct. -Echocardiogram showed no cardiogenic source of embolism -Carotid imaging unremarkable   -Patient has history rhabdo with statins, contraindicated -Aspirin 81 mg increased to aspirin 325 -Follow up with Neurology in 1 month -Atrial fibrillation: not detected by telemetry during hospitalization -tPA not given because outside window -Dysphagia screen ordered in ER -PT eval ordered: recommended SNF -Smoking cessation: not applicable      Unwitnessed fall Progressive weakness, memory loss Family report that the patient has had progressive shuffling gait, withdrawn personality, decreased function and maybe tremor.  He is being evaluated for parkinsonism, frequent falls, and memory loss over  the last 2 years. -Follow up with Neurology   Acute kidney injury Creatinine 1.8 on admission, improved to 1.3 with fluids.   Coronary artery disease Hypertension Blood pressure normal here off meds   Diabetes Patient on glipizide and Metformin at home.  However had severe hypoglycemia here.   A1c 6.4%, adequate.  Would recommend tolerating higher A1c, <8%, and stopping glipizide.              Discharge Instructions  Discharge Instructions    Discharge instructions   Complete by: As directed    Hold lisinopril Monitor BP and restart lisinopril as needed  Take aspirin 325 mg  Please schedule patient with Neurology for stroke work up and evaluation of Parkinsonism within 1 month  Stop glipizide Continue metformin  Schedule with PCP one week after discharge from SNF   Increase activity slowly   Complete by: As directed      Allergies as of 08/07/2019      Reactions   Statins Other (See Comments)   Other reaction(s): Muscle Pain Abnormal liver & kidney function, muscle break down, confusion   Penicillin G Hives      Medication List    STOP taking these medications   aspirin EC 81 MG tablet Replaced by: aspirin 325 MG tablet   glipiZIDE 10 MG tablet Commonly known as: GLUCOTROL   lisinopril 20 MG tablet Commonly known as: ZESTRIL     TAKE these medications   aspirin 325 MG tablet Take 1 tablet (325 mg total) by mouth daily. Replaces: aspirin EC 81 MG tablet   latanoprost 0.005 % ophthalmic solution Commonly known as: XALATAN Place 1 drop into both eyes at bedtime.   melatonin  5 MG Tabs Take 1 tablet (5 mg total) by mouth at bedtime as needed (sleep).   metFORMIN 500 MG tablet Commonly known as: GLUCOPHAGE Take 500 mg by mouth daily.   pantoprazole 40 MG tablet Commonly known as: PROTONIX Take 40 mg by mouth daily.      Follow-up Information    Primary care doctor. Schedule an appointment as soon as possible for a visit in 1 week(s).         Neurology. Schedule an appointment as soon as possible for a visit in 1 month(s).          Allergies  Allergen Reactions  . Statins Other (See Comments)    Other reaction(s): Muscle Pain Abnormal liver & kidney function, muscle break down, confusion  . Penicillin G Hives    Consultations:  Neurology   Procedures/Studies: CT HEAD WO CONTRAST  Result Date: 08/01/2019 CLINICAL DATA:  86 year old male status post syncope and fall. EXAM: CT HEAD WITHOUT CONTRAST TECHNIQUE: Contiguous axial images were obtained from the base of the skull through the vertex without intravenous contrast. COMPARISON:  None. FINDINGS: Brain: Ex vacuo appearing ventricular enlargement. No midline shift, ventriculomegaly, mass effect, evidence of mass lesion, intracranial hemorrhage or evidence of cortically based acute infarction. Patchy and confluent bilateral cerebral white matter hypodensity. Deep gray nuclei appear spared. No cortical encephalomalacia identified. Occasional mild dural calcifications. Vascular: Extensive Calcified atherosclerosis at the skull base. No suspicious intracranial vascular hyperdensity. Skull: No fracture identified. Sinuses/Orbits: Visualized paranasal sinuses and mastoids are clear. Other: No orbit or scalp soft tissue injury identified. IMPRESSION: 1. No acute traumatic injury identified. 2. Confluent cerebral white matter hypodensity, most commonly due to chronic small vessel disease. Electronically Signed   By: Odessa Fleming M.D.   On: 08/01/2019 22:02   CT CERVICAL SPINE WO CONTRAST  Result Date: 08/01/2019 CLINICAL DATA:  86 year old male status post syncope and fall. EXAM: CT CERVICAL SPINE WITHOUT CONTRAST TECHNIQUE: Multidetector CT imaging of the cervical spine was performed without intravenous contrast. Multiplanar CT image reconstructions were also generated. COMPARISON:  Head CT today reported separately. FINDINGS: Alignment: Straightening of cervical lordosis. Cervicothoracic  junction alignment is within normal limits. Bilateral posterior element alignment is within normal limits. Skull base and vertebrae: Visualized skull base is intact. No atlanto-occipital dissociation. No acute osseous abnormality identified. Soft tissues and spinal canal: No prevertebral fluid or swelling. No visible canal hematoma. Mild retained secretions in the nasopharynx. Calcified carotid atherosclerosis is bulky on the left. Disc levels: Degenerative subchondral cysts scattered in the cervical endplates and also in the odontoid process (sagittal image 22). Many of the endplate geodes are vacuum containing. Advanced disc and endplate degeneration at C5-C6 where mild spinal stenosis is suspected. Upper chest: Visible upper thoracic levels appear intact. Negative lung apices. IMPRESSION: 1.  No acute traumatic injury identified in the cervical spine. 2. Widespread cervical spine degeneration with up to mild spinal stenosis. 3. Bulky calcified atherosclerosis of the left carotid. Electronically Signed   By: Odessa Fleming M.D.   On: 08/01/2019 22:06   MR BRAIN WO CONTRAST  Result Date: 08/02/2019 CLINICAL DATA:  Unwitnessed fall, abnormal gait EXAM: MRI HEAD WITHOUT CONTRAST TECHNIQUE: Multiplanar, multiecho pulse sequences of the brain and surrounding structures were obtained without intravenous contrast. COMPARISON:  None. FINDINGS: Brain: There is a small 5 mm focus of reduced diffusion in the region of the left basal ganglia. No evidence of intracranial hemorrhage. Patchy and confluent areas of T2 hyperintensity in the supratentorial white matter  are nonspecific but probably reflect moderate chronic microvascular ischemic changes. Prominence of the ventricles and sulci reflects generalized parenchymal volume loss. Relative prominence of the ventricles is likely on an ex vacuo basis. There is no intracranial mass, mass effect, or edema. There is no extra-axial fluid collection. Vascular: Major vessel flow voids at  the skull base are preserved. Skull and upper cervical spine: Normal marrow signal is preserved. Sinuses/Orbits: Paranasal sinuses are aerated. Orbits are unremarkable. Other: Sella is unremarkable.  Mastoid air cells are clear. 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 Moderate chronic microvascular ischemic changes. Electronically Signed   By: Guadlupe Spanish M.D.   On: 08/02/2019 08:34   US Carotid Bilateral  Result Date: 08/02/2019 CLINICAL DATA:  Syncope and collapse. History of stroke/TIA and diabetes. EXAM: BILATERAL CAROTID DUPLEX ULTRASOUND TECHNIQUE: Wallace Cullens scale imaging, color Doppler and duplex ultrasound were performed of bilateral carotid and vertebral arteries in the neck. COMPARISON:  None. FINDINGS: Criteria: Quantification of carotid stenosis is based on velocity parameters that correlate the residual internal carotid diameter with NASCET-based stenosis levels, using the diameter of the distal internal carotid lumen as the denominator for stenosis measurement. The following velocity measurements were obtained: RIGHT ICA: 80/29 cm/sec CCA: 76/18 cm/sec SYSTOLIC ICA/CCA RATIO:  1.1 ECA: 55 cm/sec LEFT ICA: 85/26 cm/sec CCA: 64/17 cm/sec SYSTOLIC ICA/CCA RATIO:  1.3 ECA: 38 cm/sec RIGHT CAROTID ARTERY: There is a minimal amount of eccentric echogenic plaque within the right carotid bulb (image 25), extending to involve the origin and proximal aspects the right internal carotid artery (image 32), not resulting in elevated peak systolic velocities within the interrogated course of the right internal carotid artery to suggest a hemodynamically significant stenosis. RIGHT VERTEBRAL ARTERY:  Antegrade Flow LEFT CAROTID ARTERY: There is a minimal amount of eccentric echogenic plaque involving the mid aspect the left common carotid artery (image 58). There is a moderate amount of eccentric echogenic shadowing plaque within the left carotid bulb (image 67), extending to involve the origin  and proximal aspects of the left internal carotid artery (image 75), not resulting in elevated peak systolic velocities within the interrogated course of the left internal carotid artery to suggest a hemodynamically significant stenosis. LEFT VERTEBRAL ARTERY:  Antegrade Flow IMPRESSION: Minimal to moderate amount of bilateral atherosclerotic plaque, left greater than right, not resulting in a hemodynamically significant stenosis within either internal carotid artery. Electronically Signed   By: Simonne Come M.D.   On: 08/02/2019 08:38   DG Chest Portable 1 View  Result Date: 08/01/2019 CLINICAL DATA:  Syncopal episode and fall. EXAM: PORTABLE CHEST 1 VIEW COMPARISON:  None. FINDINGS: Mild atelectasis is seen within the right lung base. There is no evidence of a pleural effusion or pneumothorax. The heart size and mediastinal contours are within normal limits. There is mild calcification of the aortic arch. Degenerative changes seen throughout the bilateral shoulders and thoracic spine. IMPRESSION: No active disease. Electronically Signed   By: Aram Candela M.D.   On: 08/01/2019 22:28   ECHOCARDIOGRAM COMPLETE  Result Date: 08/02/2019    ECHOCARDIOGRAM REPORT   Patient Name:   WRIGHT GRAVELY Date of Exam: 08/02/2019 Medical Rec #:  [id number]   Height:       67.5 in Accession #:    [id number]  Weight:       188.5 lb Date of Birth:  [birth year]   BSA:          1.983 m Patient Age:    10 years  BP:           148/89 mmHg Patient Gender: M           HR:           71 bpm. Exam Location:  [facility] Procedure: 2D Echo and Intracardiac Opacification Agent Indications:     R55 Syncope  History:         Patient has no prior history of Echocardiogram examinations.                  Stroke; Risk Factors:Diabetes.  Sonographer:     L Thornton-Maynard Referring Phys:  [id number] Andris BaumannHAZEL V DUNCAN Diagnosing Phys: Debbe OdeaBrian Agbor-Etang MD  Sonographer Comments: Suboptimal parasternal window. IMPRESSIONS  1. Left ventricular ejection  fraction, by estimation, is 55 to 60%. The left ventricle has normal function. The left ventricle has no regional wall motion abnormalities. Left ventricular diastolic parameters are consistent with Grade I diastolic dysfunction (impaired relaxation).  2. Right ventricular systolic function is low normal. The right ventricular size is mildly enlarged. There is normal pulmonary artery systolic pressure.  3. The mitral valve is grossly normal. No evidence of mitral valve regurgitation.  4. The aortic valve is tricuspid. Aortic valve regurgitation is not visualized. Mild to moderate aortic valve sclerosis/calcification is present, without any evidence of aortic stenosis.  5. The inferior vena cava is normal in size with greater than 50% respiratory variability, suggesting right atrial pressure of 3 mmHg. FINDINGS  Left Ventricle: Left ventricular ejection fraction, by estimation, is 55 to 60%. The left ventricle has normal function. The left ventricle has no regional wall motion abnormalities. Definity contrast agent was given IV to delineate the left ventricular  endocardial borders. The left ventricular internal cavity size was normal in size. There is no left ventricular hypertrophy. Left ventricular diastolic parameters are consistent with Grade I diastolic dysfunction (impaired relaxation). Right Ventricle: The right ventricular size is mildly enlarged. Right vetricular wall thickness was not assessed. Right ventricular systolic function is low normal. There is normal pulmonary artery systolic pressure. The tricuspid regurgitant velocity is  2.39 m/s, and with an assumed right atrial pressure of 3 mmHg, the estimated right ventricular systolic pressure is 25.8 mmHg. Left Atrium: Left atrial size was normal in size. Right Atrium: Right atrial size was normal in size. Pericardium: There is no evidence of pericardial effusion. Mitral Valve: The mitral valve is grossly normal. No evidence of mitral valve  regurgitation. MV peak gradient, 3.5 mmHg. The mean mitral valve gradient is 1.0 mmHg. Tricuspid Valve: The tricuspid valve is normal in structure. Tricuspid valve regurgitation is mild. Aortic Valve: The aortic valve is tricuspid. Aortic valve regurgitation is not visualized. Mild to moderate aortic valve sclerosis/calcification is present, without any evidence of aortic stenosis. Aortic valve mean gradient measures 4.7 mmHg. Aortic valve peak gradient measures 8.8 mmHg. Aortic valve area, by VTI measures 2.33 cm. Pulmonic Valve: The pulmonic valve was normal in structure. Pulmonic valve regurgitation is trivial. Aorta: The aortic root is normal in size and structure. Venous: The inferior vena cava is normal in size with greater than 50% respiratory variability, suggesting right atrial pressure of 3 mmHg. IAS/Shunts: No atrial level shunt detected by color flow Doppler.  LEFT VENTRICLE PLAX 2D LVIDd:         4.48 cm     Diastology LVIDs:         3.44 cm     LV e' lateral:   7.18 cm/s LV PW:  0.90 cm     LV E/e' lateral: 6.4 LV IVS:        0.98 cm     LV e' medial:    4.57 cm/s LVOT diam:     2.20 cm     LV E/e' medial:  10.1 LV SV:         57 LV SV Index:   29 LVOT Area:     3.80 cm  LV Volumes (MOD) LV vol d, MOD A2C: 58.3 ml LV vol d, MOD A4C: 77.7 ml LV vol s, MOD A2C: 18.6 ml LV vol s, MOD A4C: 24.5 ml LV SV MOD A2C:     39.7 ml LV SV MOD A4C:     77.7 ml LV SV MOD BP:      50.3 ml RIGHT VENTRICLE RV S prime:     11.60 cm/s TAPSE (M-mode): 1.5 cm LEFT ATRIUM             Index LA diam:        2.70 cm 1.36 cm/m LA Vol (A2C):   45.0 ml 22.69 ml/m LA Vol (A4C):   33.6 ml 16.95 ml/m LA Biplane Vol: 41.8 ml 21.08 ml/m  AORTIC VALVE AV Area (Vmax):    2.10 cm AV Area (Vmean):   2.23 cm AV Area (VTI):     2.33 cm AV Vmax:           148.50 cm/s AV Vmean:          100.233 cm/s AV VTI:            0.243 m AV Peak Grad:      8.8 mmHg AV Mean Grad:      4.7 mmHg LVOT Vmax:         82.00 cm/s LVOT Vmean:         58.800 cm/s LVOT VTI:          0.149 m LVOT/AV VTI ratio: 0.61 MITRAL VALVE               TRICUSPID VALVE MV Area (PHT): 2.44 cm    TR Peak grad:   22.8 mmHg MV Peak grad:  3.5 mmHg    TR Vmax:        239.00 cm/s MV Mean grad:  1.0 mmHg MV Vmax:       0.93 m/s    SHUNTS MV Vmean:      48.5 cm/s   Systemic VTI:  0.15 m MV E velocity: 46.10 cm/s  Systemic Diam: 2.20 cm MV A velocity: 98.00 cm/s MV E/A ratio:  0.47 Debbe Odea MD Electronically signed by Debbe Odea MD Signature Date/Time: 08/02/2019/3:05:46 PM    Final       Subjective: Feeling well.  Appetite good.  No confusion, fever, focal weakness, numbness, slurred speech.  Discharge Exam: Vitals:   08/06/19 0752 08/07/19 0012  BP: 105/72 120/78  Pulse: 65 73  Resp: 18 18  Temp: 98.3 F (36.8 C)   SpO2: 99% 95%   Vitals:   08/05/19 0835 08/06/19 0024 08/06/19 0752 08/07/19 0012  BP: 118/73 119/81 105/72 120/78  Pulse: 72 66 65 73  Resp: Temp: 98.6 F (37 C)  98.3 F (36.8 C)   TempSrc:      SpO2: 97% 100% 99% 95%  Weight:      Height:        General: Pt is alert, awake, not in acute distress, sitting up, eating breakfast. Cardiovascular:  RRR, nl S1-S2, no murmurs appreciated.   No LE edema.   Respiratory: Normal respiratory rate and rhythm.  CTAB without rales or wheezes. Abdominal: Abdomen soft and non-tender.  No distension or HSM.   Neuro/Psych: Strength symmetric in upper and lower extremities but generally weak.  Bradykinesia noted.  Judgment and insight appear impaired by dementia.   The results of significant diagnostics from this hospitalization (including imaging, microbiology, ancillary and laboratory) are listed below for reference.     Microbiology: Recent Results (from the past 240 hour(s))  SARS CORONAVIRUS 2 (TAT 6-24 HRS) Nasopharyngeal Nasopharyngeal Swab     Status: None   Collection Time: 08/01/19 11:36 PM   Specimen: Nasopharyngeal Swab  Result Value Ref Range Status    SARS Coronavirus 2 NEGATIVE NEGATIVE Final    Comment: (NOTE) SARS-CoV-2 target nucleic acids are NOT DETECTED. The SARS-CoV-2 RNA is generally detectable in upper and lower respiratory specimens during the acute phase of infection. Negative results do not preclude SARS-CoV-2 infection, do not rule out co-infections with other pathogens, and should not be used as the sole basis for treatment or other patient management decisions. Negative results must be combined with clinical observations, patient history, and epidemiological information. The expected result is Negative. Fact Sheet for Patients: SugarRoll.be Fact Sheet for Healthcare Providers: https://www.woods-mathews.com/ This test is not yet approved or cleared by the [place] FDA and  has been authorized for detection and/or diagnosis of SARS-CoV-2 by FDA under an Emergency Use Authorization (EUA). This EUA will remain  in effect (meaning this test can be used) for the duration of the COVID-19 declaration under Section 56 4(b)(1) of the Act, 21 U.S.C. section 360bbb-3(b)(1), unless the authorization is terminated or revoked sooner. Performed at [facility], [place] [address] [place], [place] [address]      Labs: BNP (last 3 results) No results for input(s): BNP in the last 8760 hours. Basic Metabolic Panel: Recent Labs  Lab 08/01/19 2134 08/03/19 0509 08/04/19 0712 08/05/19 0644  NA 137 136 134* 135  K 4.9 3.9 4.7 3.6  CL 99 98 99 102  CO2 25 26 26 24   GLUCOSE 140* 98 124* 121*  BUN 30* 27* 32* 31*  CREATININE 1.82* 1.26* 1.36* 1.27*  CALCIUM 10.1 9.1 9.1 8.9  MG  --  1.7 1.8 1.8   Liver Function Tests: Recent Labs  Lab 08/01/19 2134  AST 35  ALT 17  ALKPHOS 57  BILITOT 1.4*  PROT 8.3*  ALBUMIN 4.5   No results for input(s): LIPASE, AMYLASE in the last 168 hours. No results for input(s): AMMONIA in the last 168 hours. CBC: Recent Labs  Lab  08/01/19 2134 08/03/19 0509 08/04/19 0712 08/05/19 0644  WBC 8.4 7.7 6.8 6.5  NEUTROABS 7.4  --   --   --   HGB 16.4 15.0 14.5 14.3  HCT 51.7 44.8 43.3 42.4  MCV 94.3 89.1 88.5 88.0  PLT 76* 188 196 207   Cardiac Enzymes: No results for input(s): CKTOTAL, CKMB, CKMBINDEX, TROPONINI in the last 168 hours. BNP: Invalid input(s): POCBNP CBG: Recent Labs  Lab 08/03/19 1553 08/05/19 0833 08/05/19 1227 08/05/19 1726 08/06/19 2200  GLUCAP 206* 120* 147* 121* 121*   D-Dimer No results for input(s): DDIMER in the last 72 hours. Hgb A1c No results for input(s): HGBA1C in the last 72 hours. Lipid Profile No results for input(s): CHOL, HDL, LDLCALC, TRIG, CHOLHDL, LDLDIRECT in the last 72 hours. Thyroid function studies No results for input(s):  TSH, T4TOTAL, T3FREE, THYROIDAB in the last 72 hours.  Invalid input(s): FREET3 Anemia work up No results for input(s): VITAMINB12, FOLATE, FERRITIN, TIBC, IRON, RETICCTPCT in the last 72 hours. Urinalysis    Component Value Date/Time   COLORURINE YELLOW (A) 08/02/2019 0509   APPEARANCEUR HAZY (A) 08/02/2019 0509   LABSPEC 1.019 08/02/2019 0509   PHURINE 5.0 08/02/2019 0509   GLUCOSEU NEGATIVE 08/02/2019 0509   HGBUR NEGATIVE 08/02/2019 0509   BILIRUBINUR NEGATIVE 08/02/2019 0509   KETONESUR NEGATIVE 08/02/2019 0509   PROTEINUR NEGATIVE 08/02/2019 0509   NITRITE NEGATIVE 08/02/2019 0509   LEUKOCYTESUR NEGATIVE 08/02/2019 0509   Sepsis Labs Invalid input(s): PROCALCITONIN,  WBC,  LACTICIDVEN Microbiology Recent Results (from the past 240 hour(s))  SARS CORONAVIRUS 2 (TAT 6-24 HRS) Nasopharyngeal Nasopharyngeal Swab     Status: None   Collection Time: 08/01/19 11:36 PM   Specimen: Nasopharyngeal Swab  Result Value Ref Range Status   SARS Coronavirus 2 NEGATIVE NEGATIVE Final    Comment: (NOTE) SARS-CoV-2 target nucleic acids are NOT DETECTED. The SARS-CoV-2 RNA is generally detectable in upper and lower respiratory specimens  during the acute phase of infection. Negative results do not preclude SARS-CoV-2 infection, do not rule out co-infections with other pathogens, and should not be used as the sole basis for treatment or other patient management decisions. Negative results must be combined with clinical observations, patient history, and epidemiological information. The expected result is Negative. Fact Sheet for Patients: HairSlick.no Fact Sheet for Healthcare Providers: quierodirigir.com This test is not yet approved or cleared by the [place] FDA and  has been authorized for detection and/or diagnosis of SARS-CoV-2 by FDA under an Emergency Use Authorization (EUA). This EUA will remain  in effect (meaning this test can be used) for the duration of the COVID-19 declaration under Section 56 4(b)(1) of the Act, 21 U.S.C. section 360bbb-3(b)(1), unless the authorization is terminated or revoked sooner. Performed at [facility], [address], [place], [place] [address]      Time coordinating discharge: 25 minutes The [place] controlled substances registry was reviewed for this patient      SIGNED:   Alberteen Sam, MD  [facility] 08/07/2019, 10:50 AM

## 2019-08-07 NOTE — Progress Notes (Addendum)
Removed IV before discharge. Called report to [employer], Charity fundraiser at [facility]. Patient being transported via EMS.

## 2019-08-07 NOTE — Care Management Important Message (Signed)
Important Message  Patient Details  Name: Aaron Robles MRN: [id number] Date of Birth: [birth year]   Medicare Important Message Given:  Yes     [name] A Azarias Chiou 08/07/2019, 11:13 AM

## 2021-05-10 IMAGING — MR MR HEAD W/O CM
10 of 11 series · 39 of 48 positions shown · non-contrast
Comparison: None.

CLINICAL DATA: Unwitnessed fall, abnormal gait

EXAM:
MRI HEAD WITHOUT CONTRAST
TECHNIQUE: Multiplanar, multiecho pulse sequences of the brain and surrounding
structures were obtained without intravenous contrast.

[Series 5: ax dwi_tracew · axial · 3.0mm · 0.60mm/px · z∈[-74,+80]mm · 6 of 96 slices shown]
[im 1/96]
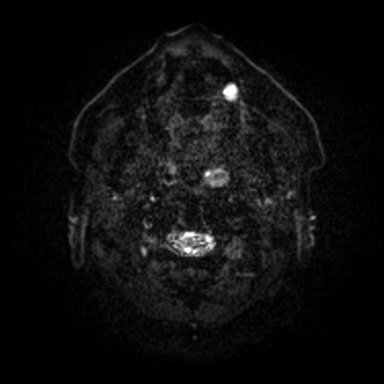
[im 20/96]
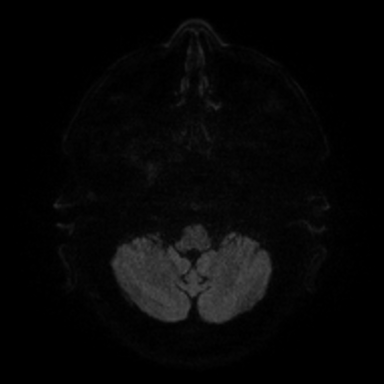
[im 39/96]
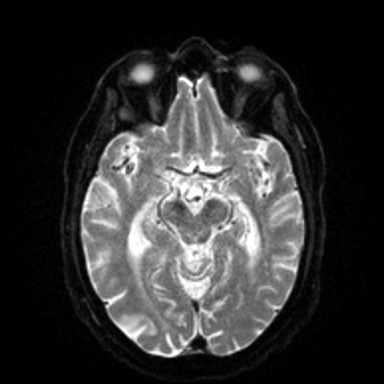
[im 58/96]
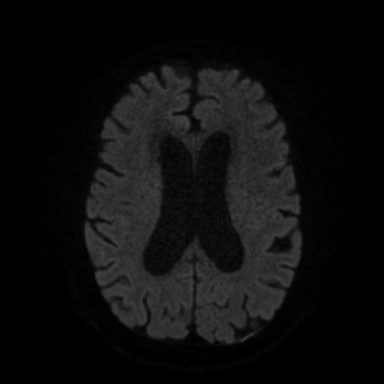
[im 77/96]
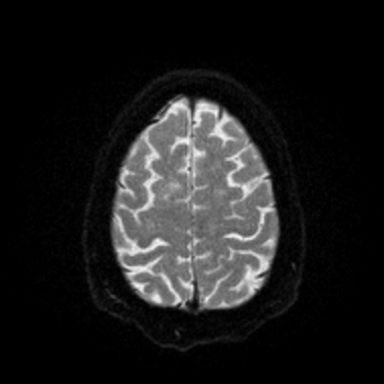
[im 96/96]
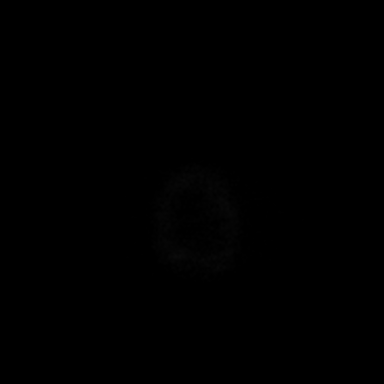

[Series 6: ax dwi_adc · axial · 3.0mm · 0.60mm/px · z∈[-74,+74]mm · 3 of 46 slices shown]
[im 1/46]
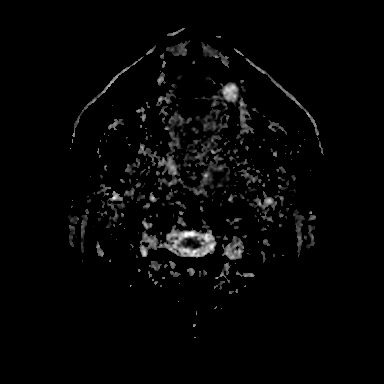
[im 23/46]
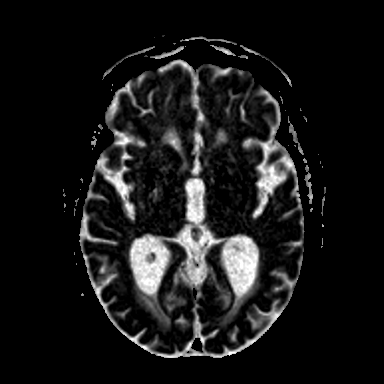
[im 46/46]
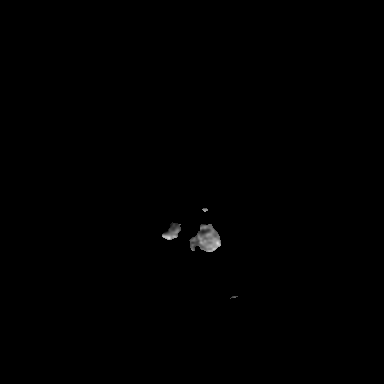

[Series 7: cor dwi_tracew · coronal · 5.0mm · 0.60mm/px · 6 of 80 slices shown]
[im 1/80]
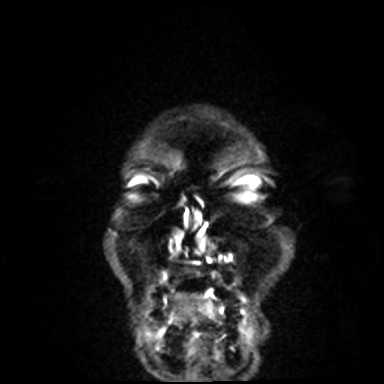
[im 16/80]
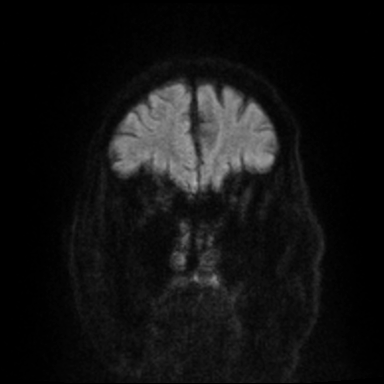
[im 32/80]
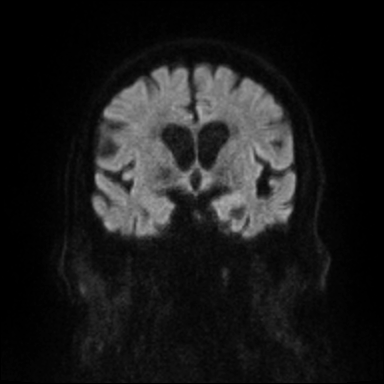
[im 48/80]
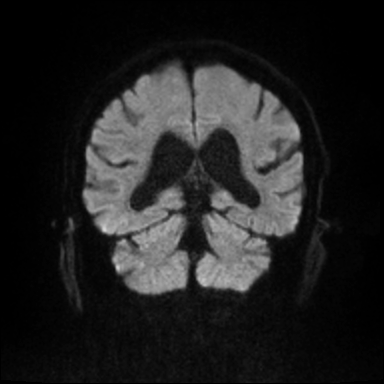
[im 64/80]
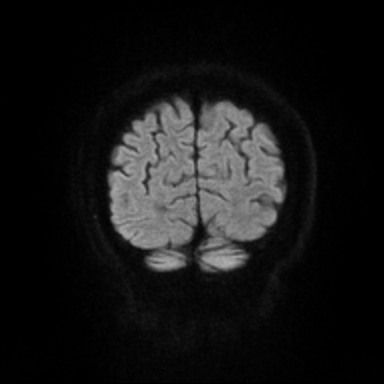
[im 80/80]
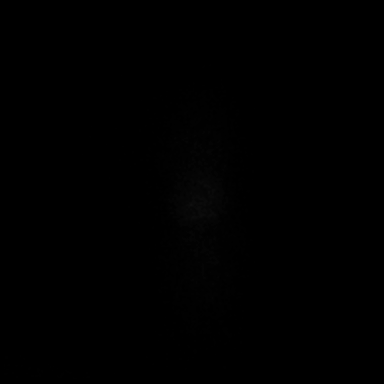

[Series 8: cor dwi_adc · coronal · 5.0mm · 0.60mm/px · 3 of 37 slices shown]
[im 1/37]
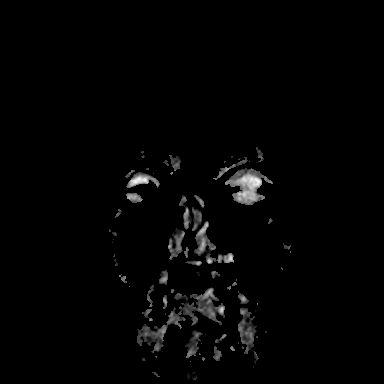
[im 19/37]
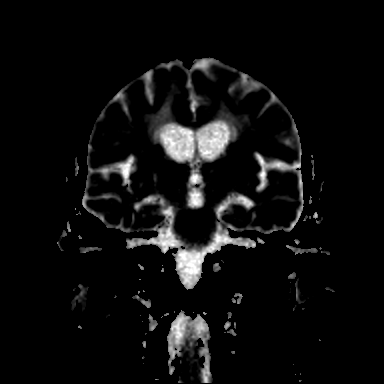
[im 37/37]
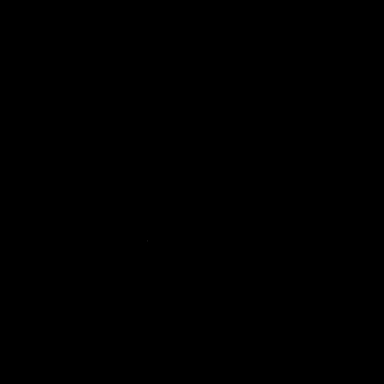

[Series 9: T1 · sagittal · 5.0mm · 0.62mm/px · 2 of 23 slices shown (1 of 2)]
[im 1/23]
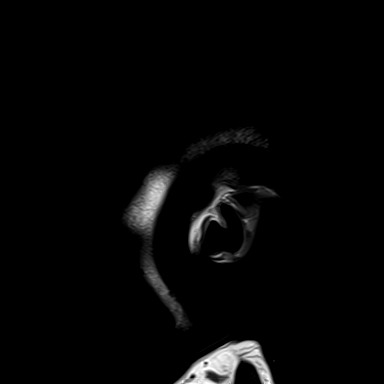
[im 23/23]
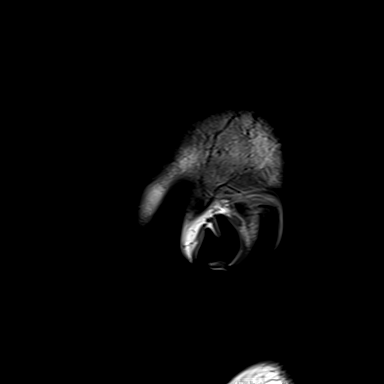

[Series 10: T2 · axial · 5.0mm · 0.53mm/px · z∈[-70,+80]mm · 2 of 26 slices shown (1 of 2)]
[im 1/26]
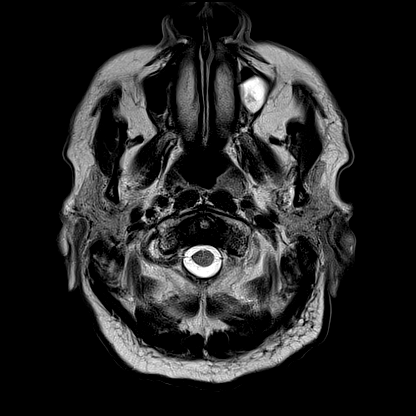
[im 26/26]
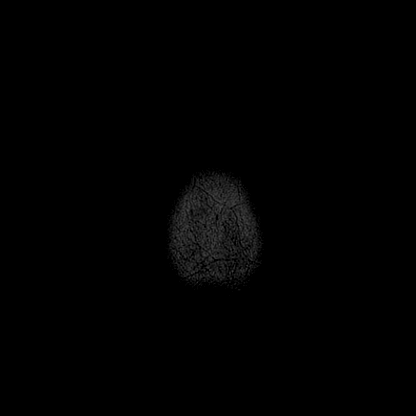

[Series 12: pha_images · axial · 3.0mm · 0.90mm/px · z∈[-84,+33]mm · 3 of 60 slices shown]
[im 1/60]
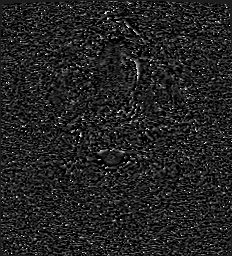
[im 20/60]
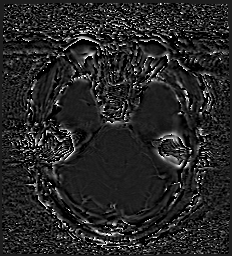
[im 40/60]
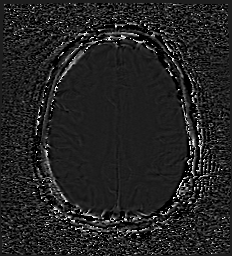

[Series 15: FLAIR · axial · 3.0mm · 0.53mm/px · z∈[-76,+86]mm · 4 of 55 slices shown]
[im 1/55]
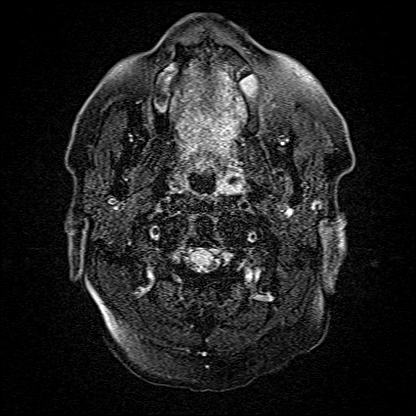
[im 19/55]
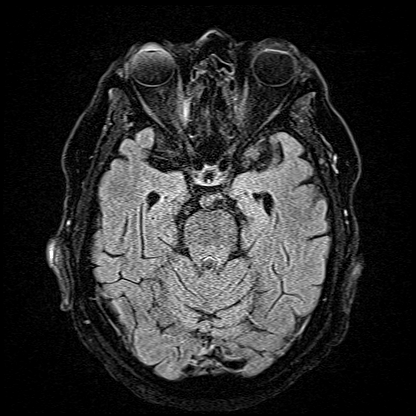
[im 37/55]
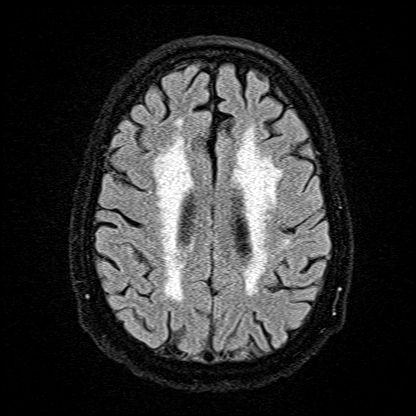
[im 55/55]
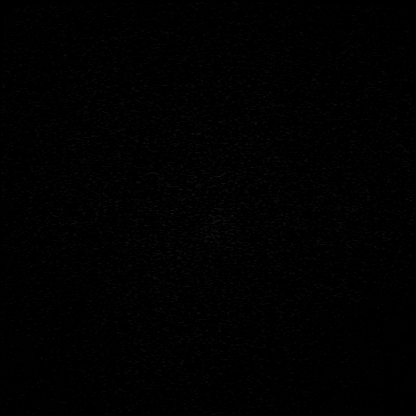

[Series 16: T1 · axial · 1.0mm · 0.98mm/px · z∈[-81,+94]mm · 8 of 176 slices shown (2 of 2)]
[im 1/176]
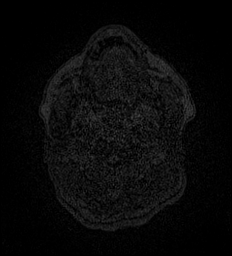
[im 32/176]
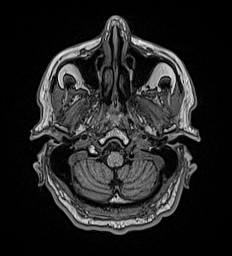
[im 48/176]
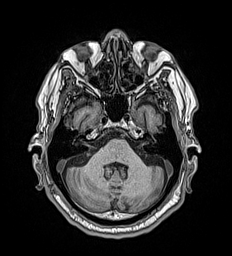
[im 80/176]
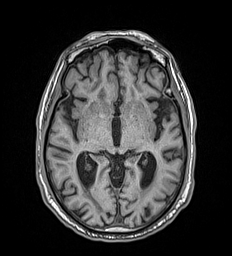
[im 96/176]
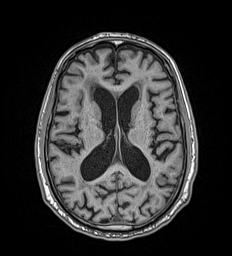
[im 128/176]
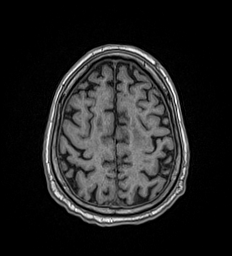
[im 144/176]
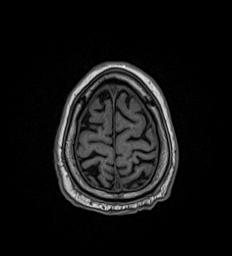
[im 176/176]
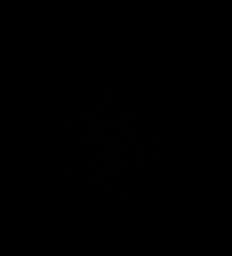

[Series 17: T2 · coronal · 5.0mm · 0.57mm/px · 2 of 29 slices shown (2 of 2)]
[im 1/29]
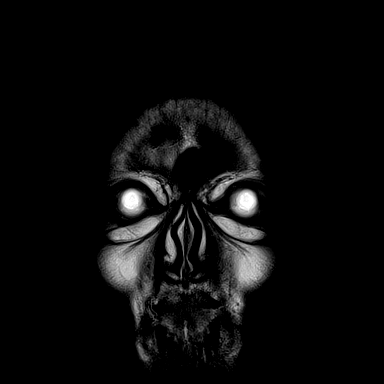
[im 29/29]
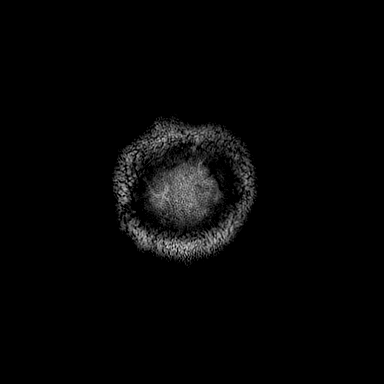

[39 of 48 positions shown; findings below may reference images not displayed]

FINDINGS: Brain: There is a small 5 mm focus of reduced diffusion in the
region of the left basal ganglia.

No evidence of intracranial hemorrhage. Patchy and confluent areas
of T2 hyperintensity in the supratentorial white matter are
nonspecific but probably reflect moderate chronic microvascular
ischemic changes. Prominence of the ventricles and sulci reflects
generalized parenchymal volume loss. Relative prominence of the
ventricles is likely on an ex vacuo basis.

There is no intracranial mass, mass effect, or edema. There is no
extra-axial fluid collection.

Vascular: Major vessel flow voids at the skull base are preserved.

Skull and upper cervical spine: Normal marrow signal is preserved.

Sinuses/Orbits: Paranasal sinuses are aerated. Orbits are
unremarkable.

Other: Sella is unremarkable.  Mastoid air cells are clear.
IMPRESSION: Small acute infarction of the left basal ganglia.

Moderate chronic microvascular ischemic changes.

## 2024-03-27 DEATH — deceased
